# Patient Record
Sex: Male | Born: 1975 | Race: Black or African American | Hispanic: No | Marital: Single | Smoking: Current some day smoker
Health system: Southern US, Community
[De-identification: ages and names within clinical notes are randomized; demographics above are authoritative.]

## PROBLEM LIST (undated history)

## (undated) DIAGNOSIS — N200 Calculus of kidney: Secondary | ICD-10-CM

## (undated) HISTORY — PX: DENTAL SURGERY: SHX609

## (undated) HISTORY — PX: TONSILLECTOMY: SUR1361

---
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
## 2001-04-29 ENCOUNTER — Emergency Department (HOSPITAL_COMMUNITY): Admission: EM | Admit: 2001-04-29 | Discharge: 2001-04-29 | Payer: Self-pay

## 2003-11-23 ENCOUNTER — Emergency Department (HOSPITAL_COMMUNITY): Admission: EM | Admit: 2003-11-23 | Discharge: 2003-11-23 | Payer: Self-pay | Admitting: Emergency Medicine

## 2004-05-20 ENCOUNTER — Emergency Department (HOSPITAL_COMMUNITY): Admission: EM | Admit: 2004-05-20 | Discharge: 2004-05-20 | Payer: Self-pay | Admitting: Emergency Medicine

## 2005-02-08 ENCOUNTER — Emergency Department (HOSPITAL_COMMUNITY): Admission: EM | Admit: 2005-02-08 | Discharge: 2005-02-08 | Payer: Self-pay | Admitting: Emergency Medicine

## 2005-04-05 ENCOUNTER — Emergency Department (HOSPITAL_COMMUNITY): Admission: EM | Admit: 2005-04-05 | Discharge: 2005-04-05 | Payer: Self-pay | Admitting: Emergency Medicine

## 2005-09-10 ENCOUNTER — Emergency Department (HOSPITAL_COMMUNITY): Admission: EM | Admit: 2005-09-10 | Discharge: 2005-09-10 | Payer: Self-pay | Admitting: *Deleted

## 2005-11-03 ENCOUNTER — Emergency Department (HOSPITAL_COMMUNITY): Admission: EM | Admit: 2005-11-03 | Discharge: 2005-11-03 | Payer: Self-pay | Admitting: *Deleted

## 2005-11-26 ENCOUNTER — Emergency Department (HOSPITAL_COMMUNITY): Admission: EM | Admit: 2005-11-26 | Discharge: 2005-11-26 | Payer: Self-pay | Admitting: Emergency Medicine

## 2005-11-28 ENCOUNTER — Emergency Department (HOSPITAL_COMMUNITY): Admission: EM | Admit: 2005-11-28 | Discharge: 2005-11-28 | Payer: Self-pay | Admitting: Family Medicine

## 2006-07-26 ENCOUNTER — Emergency Department (HOSPITAL_COMMUNITY): Admission: EM | Admit: 2006-07-26 | Discharge: 2006-07-26 | Payer: Self-pay | Admitting: Family Medicine

## 2008-03-20 ENCOUNTER — Emergency Department (HOSPITAL_COMMUNITY): Admission: EM | Admit: 2008-03-20 | Discharge: 2008-03-20 | Payer: Self-pay | Admitting: Family Medicine

## 2008-07-08 ENCOUNTER — Emergency Department (HOSPITAL_COMMUNITY): Admission: EM | Admit: 2008-07-08 | Discharge: 2008-07-08 | Payer: Self-pay | Admitting: Emergency Medicine

## 2008-09-22 ENCOUNTER — Emergency Department (HOSPITAL_COMMUNITY): Admission: EM | Admit: 2008-09-22 | Discharge: 2008-09-22 | Payer: Self-pay | Admitting: Family Medicine

## 2008-10-29 ENCOUNTER — Emergency Department (HOSPITAL_COMMUNITY): Admission: EM | Admit: 2008-10-29 | Discharge: 2008-10-29 | Payer: Self-pay | Admitting: Family Medicine

## 2008-12-18 ENCOUNTER — Emergency Department (HOSPITAL_COMMUNITY): Admission: EM | Admit: 2008-12-18 | Discharge: 2008-12-18 | Payer: Self-pay | Admitting: Family Medicine

## 2009-01-15 ENCOUNTER — Emergency Department (HOSPITAL_COMMUNITY): Admission: EM | Admit: 2009-01-15 | Discharge: 2009-01-15 | Payer: Self-pay | Admitting: Emergency Medicine

## 2009-12-07 ENCOUNTER — Emergency Department (HOSPITAL_COMMUNITY)
Admission: EM | Admit: 2009-12-07 | Discharge: 2009-12-07 | Payer: Self-pay | Source: Home / Self Care | Admitting: Emergency Medicine

## 2010-05-11 LAB — URINALYSIS, ROUTINE W REFLEX MICROSCOPIC
Bilirubin Urine: NEGATIVE
Glucose, UA: NEGATIVE mg/dL
Hgb urine dipstick: NEGATIVE
Ketones, ur: NEGATIVE mg/dL
Nitrite: NEGATIVE
Protein, ur: NEGATIVE mg/dL
Specific Gravity, Urine: 1.029 (ref 1.005–1.030)
Urobilinogen, UA: 1 mg/dL (ref 0.0–1.0)
pH: 6 (ref 5.0–8.0)

## 2010-07-04 ENCOUNTER — Emergency Department (HOSPITAL_COMMUNITY)
Admission: EM | Admit: 2010-07-04 | Discharge: 2010-07-05 | Disposition: A | Discharge status: Home / Self Care | Payer: Self-pay | Attending: Emergency Medicine | Admitting: Emergency Medicine

## 2010-07-04 DIAGNOSIS — T63391A Toxic effect of venom of other spider, accidental (unintentional), initial encounter: Secondary | ICD-10-CM | POA: Insufficient documentation

## 2010-07-04 DIAGNOSIS — M25579 Pain in unspecified ankle and joints of unspecified foot: Secondary | ICD-10-CM | POA: Insufficient documentation

## 2010-07-04 DIAGNOSIS — T6391XA Toxic effect of contact with unspecified venomous animal, accidental (unintentional), initial encounter: Secondary | ICD-10-CM | POA: Insufficient documentation

## 2010-07-04 DIAGNOSIS — M25473 Effusion, unspecified ankle: Secondary | ICD-10-CM | POA: Insufficient documentation

## 2010-07-04 DIAGNOSIS — L989 Disorder of the skin and subcutaneous tissue, unspecified: Secondary | ICD-10-CM | POA: Insufficient documentation

## 2010-07-04 DIAGNOSIS — Y929 Unspecified place or not applicable: Secondary | ICD-10-CM | POA: Insufficient documentation

## 2010-07-04 DIAGNOSIS — M25476 Effusion, unspecified foot: Secondary | ICD-10-CM | POA: Insufficient documentation

## 2012-05-12 ENCOUNTER — Encounter (HOSPITAL_COMMUNITY): Payer: Self-pay

## 2012-05-12 ENCOUNTER — Emergency Department (HOSPITAL_COMMUNITY)
Admission: EM | Admit: 2012-05-12 | Discharge: 2012-05-12 | Discharge status: Against Medical Advice | Payer: Self-pay | Attending: Emergency Medicine | Admitting: Emergency Medicine

## 2012-05-12 DIAGNOSIS — J029 Acute pharyngitis, unspecified: Secondary | ICD-10-CM | POA: Insufficient documentation

## 2012-05-12 DIAGNOSIS — R69 Illness, unspecified: Secondary | ICD-10-CM

## 2012-05-12 DIAGNOSIS — F17200 Nicotine dependence, unspecified, uncomplicated: Secondary | ICD-10-CM | POA: Insufficient documentation

## 2012-05-12 NOTE — ED Provider Notes (Signed)
Medical screening examination/treatment/procedure(s) were performed by non-physician practitioner and as supervising physician I was immediately available for consultation/collaboration.   Richardean Canal, MD 05/12/12 [phone]

## 2012-05-12 NOTE — ED Notes (Signed)
Sore throat and  Painful to swallow, began this am.  Airway patent

## 2012-05-12 NOTE — ED Notes (Signed)
Pt st's he is leaving and walked out.

## 2012-05-12 NOTE — ED Provider Notes (Signed)
Date: 05/12/2012 Patient: Bryce Baxter Admitted: 05/12/2012  7:07 PM Attending Provider: No att. providers found  [name] or his authorized caregiver has made the decision for the patient to leave the emergency department against medical advice. The patient left before evaluation could be performed by provider. Vitals as follows:  Blood pressure 131/88, pulse 75, temperature 98.2 F (36.8 C), temperature source Oral, resp. rate 20, SpO2 100.00%.   [name]  has not signed the Leaving Against Medical Advice form prior to leaving the department.  Arthor Captain 05/12/2012      Arthor Captain, PA-C 05/12/12 2318

## 2012-05-13 ENCOUNTER — Emergency Department (HOSPITAL_COMMUNITY): Payer: Self-pay

## 2012-05-13 ENCOUNTER — Emergency Department (HOSPITAL_COMMUNITY)
Admission: EM | Admit: 2012-05-13 | Discharge: 2012-05-13 | Disposition: A | Discharge status: Home / Self Care | Payer: Self-pay | Attending: Emergency Medicine | Admitting: Emergency Medicine

## 2012-05-13 ENCOUNTER — Encounter (HOSPITAL_COMMUNITY): Payer: Self-pay | Admitting: Emergency Medicine

## 2012-05-13 DIAGNOSIS — R1310 Dysphagia, unspecified: Secondary | ICD-10-CM | POA: Insufficient documentation

## 2012-05-13 DIAGNOSIS — R6883 Chills (without fever): Secondary | ICD-10-CM | POA: Insufficient documentation

## 2012-05-13 DIAGNOSIS — K029 Dental caries, unspecified: Secondary | ICD-10-CM | POA: Insufficient documentation

## 2012-05-13 DIAGNOSIS — F17200 Nicotine dependence, unspecified, uncomplicated: Secondary | ICD-10-CM | POA: Insufficient documentation

## 2012-05-13 DIAGNOSIS — R5383 Other fatigue: Secondary | ICD-10-CM | POA: Insufficient documentation

## 2012-05-13 DIAGNOSIS — J029 Acute pharyngitis, unspecified: Secondary | ICD-10-CM | POA: Insufficient documentation

## 2012-05-13 DIAGNOSIS — R5381 Other malaise: Secondary | ICD-10-CM | POA: Insufficient documentation

## 2012-05-13 LAB — CBC WITH DIFFERENTIAL/PLATELET
Basophils Absolute: 0 10*3/uL (ref 0.0–0.1)
Basophils Relative: 0 % (ref 0–1)
Eosinophils Absolute: 0.1 10*3/uL (ref 0.0–0.7)
Eosinophils Relative: 1 % (ref 0–5)
HCT: 43.7 % (ref 39.0–52.0)
Hemoglobin: 15.9 g/dL (ref 13.0–17.0)
Lymphocytes Relative: 26 % (ref 12–46)
Lymphs Abs: 4.1 10*3/uL — ABNORMAL HIGH (ref 0.7–4.0)
MCH: 30.3 pg (ref 26.0–34.0)
MCHC: 36.4 g/dL — ABNORMAL HIGH (ref 30.0–36.0)
MCV: 83.4 fL (ref 78.0–100.0)
Monocytes Absolute: 1.2 10*3/uL — ABNORMAL HIGH (ref 0.1–1.0)
Monocytes Relative: 8 % (ref 3–12)
Neutro Abs: 10.3 10*3/uL — ABNORMAL HIGH (ref 1.7–7.7)
Neutrophils Relative %: 66 % (ref 43–77)
Platelets: 255 10*3/uL (ref 150–400)
RBC: 5.24 MIL/uL (ref 4.22–5.81)
RDW: 12.8 % (ref 11.5–15.5)
WBC: 15.8 10*3/uL — ABNORMAL HIGH (ref 4.0–10.5)

## 2012-05-13 LAB — RAPID STREP SCREEN (MED CTR MEBANE ONLY): Streptococcus, Group A Screen (Direct): NEGATIVE

## 2012-05-13 LAB — POCT I-STAT, CHEM 8
BUN: 13 mg/dL (ref 6–23)
Calcium, Ion: 1.22 mmol/L (ref 1.12–1.23)
Chloride: 100 mEq/L (ref 96–112)
Creatinine, Ser: 1.2 mg/dL (ref 0.50–1.35)
Glucose, Bld: 84 mg/dL (ref 70–99)
HCT: 50 % (ref 39.0–52.0)
Hemoglobin: 17 g/dL (ref 13.0–17.0)
Potassium: 3.6 mEq/L (ref 3.5–5.1)
Sodium: 140 mEq/L (ref 135–145)
TCO2: 29 mmol/L (ref 0–100)

## 2012-05-13 MED ORDER — LIDOCAINE VISCOUS 2 % MT SOLN
20.0000 mL | Freq: Once | OROMUCOSAL | Status: AC
  Administered 2012-05-13: 20 mL via OROMUCOSAL
  Filled 2012-05-13: qty 15

## 2012-05-13 MED ORDER — IOHEXOL 300 MG/ML  SOLN
80.0000 mL | Freq: Once | INTRAMUSCULAR | Status: AC | PRN
  Administered 2012-05-13: 80 mL via INTRAVENOUS

## 2012-05-13 MED ORDER — MORPHINE SULFATE 10 MG/ML IJ SOLN
10.0000 mg | Freq: Once | INTRAMUSCULAR | Status: AC
  Administered 2012-05-13: 10 mg via INTRAMUSCULAR
  Filled 2012-05-13: qty 1

## 2012-05-13 MED ORDER — MAGIC MOUTHWASH W/LIDOCAINE: 2.0000 mL | Freq: Four times a day (QID) | ORAL | Status: DC

## 2012-05-13 NOTE — ED Provider Notes (Signed)
History    This chart was scribed for non-physician practitioner working with Vida Roller, MD by Sofie Rower, ED Scribe. This patient was seen in room TR09C/TR09C and the patient's care was started at 6:07PM.   CSN: [id number]  Arrival date & time 05/13/12  1749   First MD Initiated Contact with Patient 05/13/12 1807      Chief Complaint  Patient presents with  . Sore Throat    (Consider location/radiation/quality/duration/timing/severity/associated sxs/prior treatment) Patient is a 37 y.o. male presenting with pharyngitis. The history is provided by the patient. No language interpreter was used.  Sore Throat This is a new problem. The current episode started more than 2 days ago (3 days ago). The problem occurs constantly. The problem has been gradually worsening. Pertinent negatives include no chest pain, no abdominal pain, no headaches and no shortness of breath. Exacerbated by: swallowing. Nothing relieves the symptoms. He has tried nothing for the symptoms. The treatment provided no relief.    Bryce Baxter is a 37 y.o. male , with a history of dental abscess, who presents to the Emergency Department complaining of sore throat, onset three days ago (05/10/12). Associated symptoms include pain and difficulty with swallowing, malaise and chills. The pt reports his sore throat feels as if there is a firey ball within the base of his throat gesturing toward the The pt denies difficulty breathing, otalgia, and sinus pressure.   The pt is a current everyday smoker, however, he does not drink alcohol.   Pt does not have a PCP.    History reviewed. No pertinent past medical history.  History reviewed. No pertinent past surgical history.  No family history on file.  History  Substance Use Topics  . Smoking status: Current Every Day Smoker  . Smokeless tobacco: Not on file  . Alcohol Use: No      Review of Systems  Constitutional: Negative for fever.  HENT: Positive for sore  throat and trouble swallowing. Negative for ear pain and sinus pressure.   Respiratory: Negative for shortness of breath.   Cardiovascular: Negative for chest pain.  Gastrointestinal: Negative for abdominal pain.  Neurological: Negative for headaches.    Allergies  Review of patient's allergies indicates no known allergies.  Home Medications  No current outpatient prescriptions on file.  BP 134/88  Pulse 95  Temp(Src) 99.9 F (37.7 C) (Oral)  Resp 20  SpO2 98%  Physical Exam  Nursing note and vitals reviewed. Constitutional: He is oriented to person, place, and time. He appears well-developed and well-nourished. No distress.  HENT:  Head: Normocephalic and atraumatic.  Right Ear: Tympanic membrane normal.  Left Ear: Tympanic membrane normal.  Mouth/Throat: Oropharynx is clear and moist. Abnormal dentition. Dental caries present. No dental abscesses or edematous. No oropharyngeal exudate, posterior oropharyngeal edema, posterior oropharyngeal erythema or tonsillar abscesses.  Eyes: EOM are normal.  Neck: Neck supple. No tracheal deviation present. No mass and no thyromegaly present.  Cardiovascular: Normal rate.   Pulmonary/Chest: Effort normal. No respiratory distress.  Musculoskeletal: Normal range of motion.  Neurological: He is alert and oriented to person, place, and time.  Skin: Skin is warm and dry.  Psychiatric: He has a normal mood and affect. His behavior is normal.    ED Course  Procedures (including critical care time)  DIAGNOSTIC STUDIES: Oxygen Saturation is 98% on room air, normal by my interpretation.    COORDINATION OF CARE:   6:39 PM- Treatment plan discussed with patient. Pt agrees with treatment.  Results for orders placed during the hospital encounter of 05/13/12  RAPID STREP SCREEN      Result Value Range   Streptococcus, Group A Screen (Direct) NEGATIVE  NEGATIVE  CBC WITH DIFFERENTIAL      Result Value Range   WBC 15.8 (*) 4.0 - 10.5  K/uL   RBC 5.24  4.22 - 5.81 MIL/uL   Hemoglobin 15.9  13.0 - 17.0 g/dL   HCT 96.0  45.4 - 09.8 %   MCV 83.4  78.0 - 100.0 fL   MCH 30.3  26.0 - 34.0 pg   MCHC 36.4 (*) 30.0 - 36.0 g/dL   RDW 11.9  14.7 - 82.9 %   Platelets 255  150 - 400 K/uL   Neutrophils Relative 66  43 - 77 %   Neutro Abs 10.3 (*) 1.7 - 7.7 K/uL   Lymphocytes Relative 26  12 - 46 %   Lymphs Abs 4.1 (*) 0.7 - 4.0 K/uL   Monocytes Relative 8  3 - 12 %   Monocytes Absolute 1.2 (*) 0.1 - 1.0 K/uL   Eosinophils Relative 1  0 - 5 %   Eosinophils Absolute 0.1  0.0 - 0.7 K/uL   Basophils Relative 0  0 - 1 %   Basophils Absolute 0.0  0.0 - 0.1 K/uL  POCT I-STAT, CHEM 8      Result Value Range   Sodium 140  135 - 145 mEq/L   Potassium 3.6  3.5 - 5.1 mEq/L   Chloride 100  96 - 112 mEq/L   BUN 13  6 - 23 mg/dL   Creatinine, Ser 5.62  0.50 - 1.35 mg/dL   Glucose, Bld 84  70 - 99 mg/dL   Calcium, Ion 1.30  8.65 - 1.23 mmol/L   TCO2 29  0 - 100 mmol/L   Hemoglobin 17.0  13.0 - 17.0 g/dL   HCT 78.4  69.6 - 29.5 %   Ct Soft Tissue Neck W Contrast  05/13/2012  *RADIOLOGY REPORT*  Clinical Data: Sore throat for 2 days.  Pain with swallowing.  CT NECK WITH CONTRAST  Technique:  Multidetector CT imaging of the neck was performed with intravenous contrast.  Contrast: 80mL OMNIPAQUE IOHEXOL 300 MG/ML  SOLN  Comparison: None.  Findings: No significant mucosal or submucosal lesions are present. This study is degraded at the level of the mandible and upper cervical spine by significant patient motion.  The palatine tonsils are within normal limits.  The submandibular glands appear somewhat prominent but likely distorted by the patient motion.  The thyroid is within normal limits.  No significant cervical adenopathy is present.  The lung apices are clear.  The bone windows are unremarkable.  IMPRESSION:  1.  No acute or focal abnormality to explain the patient's symptoms. 2.  The study is significantly distorted by patient motion as  described.   Original Report Authenticated By: Marin Roberts, M.D.        No results found.   1. Sore throat       MDM  Patient has very poor dentition with multiple caries and fracture.  He is a smoker and his presentation is somewhat abnormal in terms of where he feels his pain. His pharynx appears clear. He is nontoxic and afebrile, but  I would like to r/o developing ludwig's due to his teeth. With ct soft tissue of neck.  Patient is in agreement.     Ct negative for acute abnormality.  Will d/c with magic  mouthwash.  Patient has f/u with his dentist tomorrow. No gross dsentalabscess. CT unconcerning for Ludwig's angina or spread of infection.   The patient appears reasonably screened and/or stabilized for discharge and I doubt any other medical condition or other [facility] requiring further screening, evaluation, or treatment in the ED at this time prior to discharge.     I personally performed the services described in this documentation, which was scribed in my presence. The recorded information has been reviewed and is accurate.    Arthor Captain, PA-C 05/17/12 1241

## 2012-05-13 NOTE — ED Notes (Signed)
Pt c/o sore throat x 2 days. Pt was here yesterday but left. Pt reports pain with swallowing. Pt talking in complete sentences without difficulty.

## 2012-05-13 NOTE — ED Notes (Signed)
Patient is alert and orientedx4.  Patient was explained discharge instructions and they understood them with no questions.  The patient's mother, Elwyn Lade, is coming to transport the patient home.

## 2012-05-15 ENCOUNTER — Telehealth (HOSPITAL_COMMUNITY): Payer: Self-pay | Admitting: Emergency Medicine

## 2012-05-17 NOTE — ED Provider Notes (Signed)
Medical screening examination/treatment/procedure(s) were performed by non-physician practitioner and as supervising physician I was immediately available for consultation/collaboration.    Loni Abdon D Finnigan Warriner, MD 05/17/12 1643 

## 2012-10-01 ENCOUNTER — Encounter (HOSPITAL_COMMUNITY): Payer: Self-pay | Admitting: Emergency Medicine

## 2012-10-01 ENCOUNTER — Emergency Department (HOSPITAL_COMMUNITY)
Admission: EM | Admit: 2012-10-01 | Discharge: 2012-10-01 | Disposition: A | Discharge status: Home / Self Care | Payer: Self-pay | Attending: Emergency Medicine | Admitting: Emergency Medicine

## 2012-10-01 ENCOUNTER — Emergency Department (HOSPITAL_COMMUNITY): Payer: Self-pay

## 2012-10-01 DIAGNOSIS — M25519 Pain in unspecified shoulder: Secondary | ICD-10-CM | POA: Insufficient documentation

## 2012-10-01 DIAGNOSIS — M25511 Pain in right shoulder: Secondary | ICD-10-CM

## 2012-10-01 DIAGNOSIS — F17200 Nicotine dependence, unspecified, uncomplicated: Secondary | ICD-10-CM | POA: Insufficient documentation

## 2012-10-01 MED ORDER — KETOROLAC TROMETHAMINE 60 MG/2ML IM SOLN
60.0000 mg | Freq: Once | INTRAMUSCULAR | Status: AC
  Administered 2012-10-01: 60 mg via INTRAMUSCULAR
  Filled 2012-10-01: qty 2

## 2012-10-01 MED ORDER — IBUPROFEN 600 MG PO TABS: 600.0000 mg | ORAL_TABLET | Freq: Four times a day (QID) | ORAL | Status: DC | PRN

## 2012-10-01 MED ORDER — METHOCARBAMOL 500 MG PO TABS: 500.0000 mg | ORAL_TABLET | Freq: Two times a day (BID) | ORAL | Status: DC

## 2012-10-01 NOTE — ED Provider Notes (Signed)
Medical screening examination/treatment/procedure(s) were performed by non-physician practitioner and as supervising physician I was immediately available for consultation/collaboration.   Edye Hainline, MD 10/01/12 2321 

## 2012-10-01 NOTE — ED Provider Notes (Signed)
CSN: [id number]     Arrival date & time 10/01/12  1836 History  This chart was scribed for a non-physician practitioner working with Loren Racer, MD by Jiles Prows, ED scribe. This patient was seen in room TR05C/TR05C and the patient's care was started at 8:06 PM.  Chief Complaint  Patient presents with  . Shoulder Pain   The history is provided by the patient and medical records. No language interpreter was used.   HPI Comments: Bryce Baxter is a 37 y.o. male who presents to the Emergency Department complaining of moderate, constant pain to right arm onset 2 days ago.  He states that he cannot turn his head secondary to pain.  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  Pt believes that he pulled a muscle in the right back/ shoulder.  Pt denies headache, diaphoresis, fever, chills, nausea, vomiting, diarrhea, weakness, cough, SOB and any other pain.   History reviewed. No pertinent past medical history. History reviewed. No pertinent past surgical history. No family history on file. History  Substance Use Topics  . Smoking status: Current Every Day Smoker  . Smokeless tobacco: Not on file  . Alcohol Use: No    Review of Systems  All other systems reviewed and are negative.   Allergies  Review of patient's allergies indicates no known allergies.  Home Medications  No current outpatient prescriptions on file. BP 136/84  Pulse 58  Temp(Src) 98.4 F (36.9 C) (Oral)  Resp 14  SpO2 100% Physical Exam  Nursing note and vitals reviewed. Constitutional: He is oriented to person, place, and time. He appears well-developed and well-nourished. No distress.  HENT:  Head: Normocephalic and atraumatic.  Eyes: EOM are normal.  Neck: Neck supple. No tracheal deviation present.  Cardiovascular: Normal rate.   Pulmonary/Chest: Effort normal. No respiratory distress.  Musculoskeletal: Normal range of motion. He exhibits tenderness.  Right sided rhomboids and trapezius muscle  group tender to palpation.  Neurological: He is alert and oriented to person, place, and time.  Skin: Skin is warm and dry.  Psychiatric: He has a normal mood and affect. His behavior is normal.    ED Course  Procedures (including critical care time) DIAGNOSTIC STUDIES: Filed Vitals:   10/01/12 1928  BP: 136/84  Pulse: 58  Temp: 98.4 F (36.9 C)  TempSrc: Oral  Resp: 14  SpO2: 100%   COORDINATION OF CARE: 8:35 PM - Discussed ED treatment with pt at bedside including IM pain management and pill pain management and pt agrees. Pt advised to take ibuprofen 3x a day for a week and muscle relaxer every day.  Pt advised to ice area.  Labs Review Labs Reviewed - No data to display Imaging Review Dg Shoulder Right  10/01/2012   CLINICAL DATA:  Right posterior shoulder pain.  EXAM: RIGHT SHOULDER - 2+ VIEW  COMPARISON:  None.  FINDINGS: There is no evidence of fracture or dislocation. There is no evidence of arthropathy or other focal bone abnormality. Soft tissues are unremarkable.  IMPRESSION: Negative.   Electronically Signed   By: Charlett Nose   On: 10/01/2012 20:13    MDM   1. Shoulder pain, right    Musculoskeletal pain, likely muscle cramps of the rhomboids/trapezius. No bony deformity, range of motion intact. Outpatient followup.  I personally performed the services described in this documentation, which was scribed in my presence. The recorded information has been reviewed and is accurate.     Roxy Horseman, PA-C 10/01/12 2153

## 2012-10-01 NOTE — ED Notes (Addendum)
PT. REPORTS RIGHT SHOULDER MUSCLE STIFFNESS / SORENESS ONSET YESTERDAY , DENIES INJURY , PT. STATED HE LIFTS HEAVY DISHES AT WORK . PAIN WORSE WITH MOVEMENT AND CERTAIN POSITIONS .

## 2012-10-08 ENCOUNTER — Emergency Department (HOSPITAL_COMMUNITY)
Admission: EM | Admit: 2012-10-08 | Discharge: 2012-10-08 | Disposition: A | Discharge status: Home / Self Care | Payer: Self-pay | Attending: Emergency Medicine | Admitting: Emergency Medicine

## 2012-10-08 ENCOUNTER — Encounter (HOSPITAL_COMMUNITY): Payer: Self-pay | Admitting: *Deleted

## 2012-10-08 DIAGNOSIS — Y990 Civilian activity done for income or pay: Secondary | ICD-10-CM | POA: Insufficient documentation

## 2012-10-08 DIAGNOSIS — T23201A Burn of second degree of right hand, unspecified site, initial encounter: Secondary | ICD-10-CM

## 2012-10-08 DIAGNOSIS — T23259A Burn of second degree of unspecified palm, initial encounter: Secondary | ICD-10-CM | POA: Insufficient documentation

## 2012-10-08 DIAGNOSIS — X19XXXA Contact with other heat and hot substances, initial encounter: Secondary | ICD-10-CM | POA: Insufficient documentation

## 2012-10-08 DIAGNOSIS — Y9229 Other specified public building as the place of occurrence of the external cause: Secondary | ICD-10-CM | POA: Insufficient documentation

## 2012-10-08 DIAGNOSIS — F17200 Nicotine dependence, unspecified, uncomplicated: Secondary | ICD-10-CM | POA: Insufficient documentation

## 2012-10-08 MED ORDER — OXYCODONE-ACETAMINOPHEN 5-325 MG PO TABS: 1.0000 | ORAL_TABLET | ORAL | Status: DC | PRN

## 2012-10-08 MED ORDER — SILVER SULFADIAZINE 1 % EX CREA
TOPICAL_CREAM | Freq: Once | CUTANEOUS | Status: AC
  Administered 2012-10-08: 1 via TOPICAL
  Filled 2012-10-08: qty 85

## 2012-10-08 NOTE — ED Provider Notes (Signed)
CSN: [id number]     Arrival date & time 10/08/12  1808 History  This chart was scribed for non-physician practitioner Fayrene Helper, PA-C, working with Ward Givens, MD by Dorothey Baseman, ED Scribe. This patient was seen in room TR05C/TR05C and the patient's care was started at 6:32 PM.    Chief Complaint  Patient presents with  . Hand Burn    Patient is a 37 y.o. male presenting with burn. The history is provided by the patient. No language interpreter was used.  Burn Burn location:  Hand Hand burn location:  R palm Burn quality:  Intact blister Mechanism of burn:  Hot surface Incident location:  Work Tetanus status:  Up to date  HPI Comments: Bryce Baxter is a 37 y.o. male who presents to the Emergency Department complaining of a burn to the palm of his right hand that occurred 2 days ago when he picked up a hot sautee pan at work. He reports associated tenderness to the area and was able to work today. Patient denies any loss of sensation. Patient reports his tetanus vaccination is up to date.  History reviewed. No pertinent past medical history. History reviewed. No pertinent past surgical history. No family history on file. History  Substance Use Topics  . Smoking status: Current Every Day Smoker  . Smokeless tobacco: Not on file  . Alcohol Use: No    Review of Systems  Neurological: Negative for numbness.  All other systems reviewed and are negative.    Allergies  Review of patient's allergies indicates no known allergies.  Home Medications   Current Outpatient Rx  Name  Route  Sig  Dispense  Refill  . ibuprofen (ADVIL,MOTRIN) 600 MG tablet   Oral   Take 1 tablet (600 mg total) by mouth every 6 (six) hours as needed for pain.   30 tablet   0    Triage Vitals: BP 118/75  Pulse 86  Temp(Src) 97.7 F (36.5 C) (Oral)  Wt 153 lb (69.4 kg)  SpO2 96%  Physical Exam  Nursing note and vitals reviewed. Constitutional: He is oriented to person, place, and time. He  appears well-developed and well-nourished. No distress.  HENT:  Head: Normocephalic and atraumatic.  Eyes: Conjunctivae are normal.  Neck: Normal range of motion. Neck supple.  Musculoskeletal: Normal range of motion.  Neurological: He is alert and oriented to person, place, and time.  Skin: Skin is warm and dry.  Tenderness to palpation to the right palmar surface. Second degree skin burn to the right palmar surface. Intact blisters overlying the thenar eminence, measuring 2 x 3 cm.  Psychiatric: He has a normal mood and affect. His behavior is normal.    ED Course  Procedures (including critical care time)  DIAGNOSTIC STUDIES: Oxygen Saturation is 96% on room air, adequate by my interpretation.    COORDINATION OF CARE: 6:34PM- Will order Silvadene cream to treat the burn and advised patient to apply it daily for 5-7 days or until the burn is gone. Advised patient not to pop any blisters to the area. Advised patient to follow up if there are any signs of infection. Discussed treatment plan with patient at bedside and patient verbalized agreement.   Labs Review Labs Reviewed - No data to display Imaging Review No results found.  MDM   1. Burn of hand, second degree, right, initial encounter    BP 118/75  Pulse 86  Temp(Src) 97.7 F (36.5 C) (Oral)  Wt 153 lb (69.4 kg)  SpO2 96%  I personally performed the services described in this documentation, which was scribed in my presence. The recorded information has been reviewed and is accurate.     Fayrene Helper, PA-C 10/08/12 1844

## 2012-10-08 NOTE — ED Provider Notes (Signed)
Medical screening examination/treatment/procedure(s) were performed by non-physician practitioner and as supervising physician I was immediately available for consultation/collaboration. Devoria Albe, MD, Armando Gang   Ward Givens, MD 10/08/12 [phone]

## 2012-10-08 NOTE — ED Notes (Signed)
Pt burned right inner palm on sautee pan on Saturday.  Pt has intact large blister to area.

## 2012-10-08 NOTE — Discharge Instructions (Signed)
Apply cream to affected area twice daily for the next 7 days.  Take pain medication as needed.  Return if you notice signs of infection.  Burn Care Your skin is a natural barrier to infection. It is the largest organ of your body. Burns damage this natural protection. To help prevent infection, it is very important to follow your caregiver's instructions in the care of your burn. Burns are classified as:  First degree. There is only redness of the skin (erythema). No scarring is expected.  Second degree. There is blistering of the skin. Scarring may occur with deeper burns.  Third degree. All layers of the skin are injured, and scarring is expected. HOME CARE INSTRUCTIONS   Wash your hands well before changing your bandage.  Change your bandage as often as directed by your caregiver.  Remove the old bandage. If the bandage sticks, you may soak it off with cool, clean water.  Cleanse the burn thoroughly but gently with mild soap and water.  Pat the area dry with a clean, dry cloth.  Apply a thin layer of antibacterial cream to the burn.  Apply a clean bandage as instructed by your caregiver.  Keep the bandage as clean and dry as possible.  Elevate the affected area for the first 24 hours, then as instructed by your caregiver.  Only take over-the-counter or prescription medicines for pain, discomfort, or fever as directed by your caregiver. SEEK IMMEDIATE MEDICAL CARE IF:   You develop excessive pain.  You develop redness, tenderness, swelling, or red streaks near the burn.  The burned area develops yellowish-white fluid (pus) or a bad smell.  You have a fever. MAKE SURE YOU:   Understand these instructions.  Will watch your condition.  Will get help right away if you are not doing well or get worse. Document Released: 01/24/2005 Document Revised: 04/18/2011 Document Reviewed: 06/16/2010 [facility] Patient Information 2014 [place], [place].

## 2012-10-15 ENCOUNTER — Encounter (HOSPITAL_COMMUNITY): Payer: Self-pay | Admitting: Emergency Medicine

## 2012-10-15 ENCOUNTER — Emergency Department (HOSPITAL_COMMUNITY)
Admission: EM | Admit: 2012-10-15 | Discharge: 2012-10-15 | Disposition: A | Discharge status: Home / Self Care | Payer: Self-pay | Attending: Emergency Medicine | Admitting: Emergency Medicine

## 2012-10-15 DIAGNOSIS — L259 Unspecified contact dermatitis, unspecified cause: Secondary | ICD-10-CM

## 2012-10-15 DIAGNOSIS — F17200 Nicotine dependence, unspecified, uncomplicated: Secondary | ICD-10-CM | POA: Insufficient documentation

## 2012-10-15 DIAGNOSIS — L258 Unspecified contact dermatitis due to other agents: Secondary | ICD-10-CM | POA: Insufficient documentation

## 2012-10-15 NOTE — ED Notes (Signed)
PA at bedside.

## 2012-10-15 NOTE — ED Provider Notes (Signed)
CSN: [id number]     Arrival date & time 10/15/12  1951 History   First MD Initiated Contact with Patient 10/15/12 2018     Chief Complaint  Patient presents with  . Blister   (Consider location/radiation/quality/duration/timing/severity/associated sxs/prior Treatment) HPI  Bryce Baxter is a 37 y.o. male complaining of new onset 2 stinging blisters to dorsum of right hand. Patient was seen for a thermal blister to almost right hand several days ago. He's been applying Silvadene, patient works as a Public affairs consultant and when he is working he applies caused the blister, wrapped it tightly in tape and then inserted into a thick plastic dishwashing gloves. He then applies tape to the wrist so that no water is allowed to enter.  History reviewed. No pertinent past medical history. History reviewed. No pertinent past surgical history. No family history on file. History  Substance Use Topics  . Smoking status: Current Every Day Smoker  . Smokeless tobacco: Not on file  . Alcohol Use: No    Review of Systems 10 systems reviewed and found to be negative, except as noted in the HPI   Allergies  Review of patient's allergies indicates no known allergies.  Home Medications   Current Outpatient Rx  Name  Route  Sig  Dispense  Refill  . ibuprofen (ADVIL,MOTRIN) 600 MG tablet   Oral   Take 1 tablet (600 mg total) by mouth every 6 (six) hours as needed for pain.   30 tablet   0   . oxyCODONE-acetaminophen (PERCOCET/ROXICET) 5-325 MG per tablet   Oral   Take 1 tablet by mouth every 4 (four) hours as needed for pain.   20 tablet   0    BP 114/74  Pulse 74  Temp(Src) 98.3 F (36.8 C) (Oral)  Resp 18  SpO2 98% Physical Exam  Nursing note and vitals reviewed. Constitutional: He is oriented to person, place, and time. He appears well-developed and well-nourished. No distress.  HENT:  Head: Normocephalic.  Mouth/Throat: Oropharynx is clear and moist.  Eyes: Conjunctivae and EOM are normal.  Pupils are equal, round, and reactive to light.  Cardiovascular: Normal rate.   Pulmonary/Chest: Effort normal and breath sounds normal. No stridor. No respiratory distress. He has no wheezes. He has no rales. He exhibits no tenderness.  Abdominal: Soft. Bowel sounds are normal. He exhibits no distension and no mass. There is no tenderness. There is no rebound and no guarding.  Musculoskeletal: Normal range of motion.  Neurological: He is alert and oriented to person, place, and time.  Skin:  Well-healing second-degree blister to palm of right hand on the thenar eminence, and intact.  Patient has scattered, 3-4 mm posterior to dorsum of right hand where patient came into contact with tape. No erythema, warmth, purulent discharge.   Psychiatric: He has a normal mood and affect.          ED Course  Procedures (including critical care time) Labs Review Labs Reviewed - No data to display Imaging Review No results found.  MDM   1. Contact dermatitis    Filed Vitals:   10/15/12 2000  BP: 114/74  Pulse: 74  Temp: 98.3 F (36.8 C)  TempSrc: Oral  Resp: 18  SpO2: 98%     Bryce Baxter is a 37 y.o. male with blisters in the perfect distribution of where tape is applied to the hand. This is likely a contact dermatitis or a milia type rash from the heat and humidity. No signed of infection.  Advised DC of tape and leave open to air. Return precautions discussed.   Pt is hemodynamically stable, appropriate for, and amenable to discharge at this time. Pt verbalized understanding and agrees with care plan. All questions answered. Outpatient follow-up and specific return precautions discussed.    Note: Portions of this report may have been transcribed using voice recognition software. Every effort was made to ensure accuracy; however, inadvertent computerized transcription errors may be present      Wynetta Emery, PA-C 10/16/12 1812

## 2012-10-15 NOTE — ED Notes (Signed)
Pt. reports persistent pain at right hand with blisters , seen here 9/1/ 2014 prescribed with Percocet and referral to Dr. Amanda Pea.

## 2012-10-16 ENCOUNTER — Telehealth (HOSPITAL_COMMUNITY): Payer: Self-pay | Admitting: *Deleted

## 2012-10-16 NOTE — ED Notes (Signed)
Patient called because he was asked by [employer] Experience if got his rx filled. Patient was notified by current scriber that no rx was ordered on  Last visit.

## 2012-10-17 NOTE — ED Provider Notes (Signed)
Medical screening examination/treatment/procedure(s) were performed by non-physician practitioner and as supervising physician I was immediately available for consultation/collaboration.   Loren Racer, MD 10/17/12 [phone]

## 2013-03-22 ENCOUNTER — Emergency Department (HOSPITAL_COMMUNITY)
Admission: EM | Admit: 2013-03-22 | Discharge: 2013-03-22 | Disposition: A | Discharge status: Home / Self Care | Payer: Self-pay | Attending: Emergency Medicine | Admitting: Emergency Medicine

## 2013-03-22 ENCOUNTER — Encounter (HOSPITAL_COMMUNITY): Payer: Self-pay | Admitting: Emergency Medicine

## 2013-03-22 DIAGNOSIS — B353 Tinea pedis: Secondary | ICD-10-CM | POA: Insufficient documentation

## 2013-03-22 DIAGNOSIS — Z87828 Personal history of other (healed) physical injury and trauma: Secondary | ICD-10-CM | POA: Insufficient documentation

## 2013-03-22 DIAGNOSIS — B351 Tinea unguium: Secondary | ICD-10-CM | POA: Insufficient documentation

## 2013-03-22 DIAGNOSIS — M79676 Pain in unspecified toe(s): Secondary | ICD-10-CM

## 2013-03-22 DIAGNOSIS — F17200 Nicotine dependence, unspecified, uncomplicated: Secondary | ICD-10-CM | POA: Insufficient documentation

## 2013-03-22 MED ORDER — TERBINAFINE HCL 1 % EX CREA: 1.0000 "application " | TOPICAL_CREAM | Freq: Two times a day (BID) | CUTANEOUS | Status: DC

## 2013-03-22 MED ORDER — IBUPROFEN 600 MG PO TABS: 600.0000 mg | ORAL_TABLET | Freq: Four times a day (QID) | ORAL | Status: DC | PRN

## 2013-03-22 MED ORDER — TRAMADOL HCL 50 MG PO TABS: 50.0000 mg | ORAL_TABLET | Freq: Four times a day (QID) | ORAL | Status: DC | PRN

## 2013-03-22 NOTE — ED Notes (Signed)
Pt in c/o left pinky toe pain, states a TV tell on it 5 years ago and he has residual pain.

## 2013-03-22 NOTE — ED Provider Notes (Signed)
Medical screening examination/treatment/procedure(s) were performed by non-physician practitioner and as supervising physician I was immediately available for consultation/collaboration.  EKG Interpretation   None         Kristen N Ward, DO 03/22/13 2350 

## 2013-03-22 NOTE — Discharge Instructions (Signed)
Please read and follow all provided instructions.  Your diagnoses today include:  1. Toe pain   2. Tinea pedis   3. Onychomycosis     Tests performed today include:  Vital signs. See below for your results today.   Medications prescribed:   Tramadol - narcotic-like pain medication  DO NOT drive or perform any activities that require you to be awake and alert because this medicine can make you drowsy.    Ibuprofen (Motrin, Advil) - anti-inflammatory pain medication  Do not exceed 600mg  ibuprofen every 6 hours, take with food  You have been prescribed an anti-inflammatory medication or NSAID. Take with food. Take smallest effective dose for the shortest duration needed for your pain. Stop taking if you experience stomach pain or vomiting.    Lamisil - medication for athlete's foot  Take any prescribed medications only as directed.  Home care instructions:   Follow any educational materials contained in this packet  Follow R.I.C.E. Protocol:  R - rest your injury   I  - use ice on injury without applying directly to skin  C - compress injury with bandage or splint  E - elevate the injury as much as possible  Follow-up instructions: Please follow-up with your primary care provider or the provided orthopedic physician (bone specialist) if you continue to have significant pain or trouble walking in 1 week. In this case you may have a severe injury that requires further care.   If you do not have a primary care doctor -- see below for referral information.   Return instructions:   Please return if your toes are numb or tingling, appear gray or blue, or you have severe painPlease return to the Emergency Department if you experience worsening symptoms.   Please return if you have any other emergent concerns.  Additional Information:  Your vital signs today were: BP 128/79   Pulse 60   Temp(Src) 97.9 F (36.6 C) (Oral)   Resp 15   SpO2 98% If your blood pressure (BP)  was elevated above 135/85 this visit, please have this repeated by your doctor within one month. --------------  Emergency Department Resource Guide 1) Find a Doctor and Pay Out of Pocket Although you won't have to find out who is covered by your insurance plan, it is a good idea to ask around and get recommendations. You will then need to call the office and see if the doctor you have chosen will accept you as a new patient and what types of options they offer for patients who are self-pay. Some doctors offer discounts or will set up payment plans for their patients who do not have insurance, but you will need to ask so you aren't surprised when you get to your appointment.  2) Contact Your Local Health Department Not all health departments have doctors that can see patients for sick visits, but many do, so it is worth a call to see if yours does. If you don't know where your local health department is, you can check in your phone book. The CDC also has a tool to help you locate your state's health department, and many state websites also have listings of all of their local health departments.  3) Find a Walk-in Clinic If your illness is not likely to be very severe or complicated, you may want to try a walk in clinic. These are popping up all over the country in pharmacies, drugstores, and shopping centers. They're usually staffed by nurse practitioners or  physician assistants that have been trained to treat common illnesses and complaints. They're usually fairly quick and inexpensive. However, if you have serious medical issues or chronic medical problems, these are probably not your best option.  No Primary Care Doctor: - Call Health Connect at  [phone] - they can help you locate a primary care doctor that  accepts your insurance, provides certain services, etc. - Physician Referral Service- [phone]  Chronic Pain Problems: Organization         Address  Phone   Notes  [facility]  [phone] Patients need to be referred by their primary care doctor.   Medication Assistance: Organization         Address  Phone   Notes  [facility] Medication [facility] [address] [place] [place], [place] [address] [phone] --Must be a resident of [facility] -- Must have NO insurance coverage whatsoever (no Medicaid/ Medicare, etc.) -- The pt. MUST have a primary care doctor that directs their care regularly and follows them in the community   [facility]ist  [phone]   [employer]  [phone]    Agencies that provide inexpensive medical care: Organization         Address  Phone   Notes  [facility]  [phone]   [facility]    [phone]   [facility] [address] [place], [place] [address] [phone]   [facility] [address] [place] [place] [phone]   Planned Parenthood    [phone]   [facility]    [phone]   [facility]  [address], [place] Phone:  [phone], Fax:  [phone] Hours of Operation:  9 am - 6 pm, M-F.  Also accepts Medicaid/Medicare and self-pay.  [facility]  [address], [place] Phone: [phone], Fax: [phone]. Hours of Operation:  8:30 am - 5:30 pm, M-F.  Also accepts Medicaid and self-pay.  [facility] [place] [address] [place] Phone: [phone]   [facility] [address] Natasha Bence [place], [place] [phone], Ext. [phone] Mondays & Thursdays: 7-9 AM.  First 15 patients are seen on a first come, first serve basis.    Medicaid-accepting [facility] Providers:  Organization         Address  Phone   Notes  [facility] [address], [place] [phone] Also accepts self-pay patients.  [facility] [address]  Laurell Josephs [place], [place]  [phone]   [facility] [address], [place] [phone]   [facility] [address], [place] [phone]   Renaye Rakers [address], [place]   [phone] Only accepts [place] patients after they have their name applied to their card.   Self-Pay (no insurance) in [facility]:  Organization         Address  Phone   Notes  Sickle Cell Patients, [facility] [address] [place], [place] [phone]   [facility] [address] [place], [place] [phone]   [facility] [place]  [address], [place] [phone]   [facility]/Dr. Julio Sicks  [address] [place]  [place] [address], [place] or [address], [place] [phone] Phone number for both [place] and [place] locations is the same.  [facility] [address] [place] [phone]   [facility] [address], [place] or [address] [phone]   [facility] [address] [place] [phone], phone; [phone], fax Sees patients 1st and 3rd Saturday of every month.  Must not qualify for public or private insurance (i.e. Medicaid, Medicare, [place] Health Choice, Veterans' Benefits)  Household income should be no more than 200% of the poverty level The clinic cannot treat you if you are pregnant or think you are pregnant  Sexually transmitted diseases are not treated at the clinic.    Dental Care: Organization         Address  Phone  Notes  [facility] Department of [facility] [address] [place], [place] [phone] Accepts children up to age 10 who are enrolled in [place] or [place] Health Choice; pregnant women with a Medicaid card; and children who have applied for Medicaid  or [place] Health Choice, but were declined, whose parents can pay a reduced fee at time of service.  [facility] of [facility]  [address], [place] [phone] Accepts children up to age 57 who are enrolled in [place] or [place] Health Choice; pregnant women with a Medicaid card; and children who have applied for Medicaid or  Health Choice, but were declined, whose parents can pay a reduced fee at time of service.  [facility]  [address] [place], [place] [phone] Patients are seen by appointment only. Walk-ins are not accepted. [facility] will see patients 61 years of age and older. Monday - Tuesday (8am-5pm) Most Wednesdays (8:30-5pm) $30 per visit, cash only  [facility]  [address], [facility] [phone] Patients are seen by appointment only. Walk-ins are not accepted. [facility] will see patients 37 years of age and older. One Wednesday Evening (Monthly: Volunteer Based).  $30 per visit, cash only  [employer]  [phone] for adults; Children under age 68, call Graduate Pediatric Dentistry at [phone]. Children aged 12-14, please call [phone] to request a pediatric application.  Dental services are provided in all areas of dental care including fillings, crowns and bridges, complete and partial dentures, implants, gum treatment, root canals, and extractions. Preventive care is also provided. Treatment is provided to both adults and children. Patients are selected via a lottery and there is often a waiting list.   [facility] [address] [place]  [phone] [email or web address]   [facility] [address] [place], [place] [phone], Ext. [phone] Second and Fourth Thursday of each month, opens at 6:30 AM; Clinic ends at 9 AM.  Patients are seen on a first-come first-served basis, and a limited number are seen  during each clinic.   [facility]  [address] [place], [place] [phone]   Eligibility Requirements You must have lived in [place], [place], or [place] counties for at least the last three months.   You cannot be eligible for state or federal sponsored [place], including [employer], [place], or [employer].   You generally cannot be eligible for healthcare insurance through your employer.    How  to apply: Eligibility screenings are held every Tuesday and Wednesday afternoon from 1:00 pm until 4:00 pm. You do not need an appointment for the interview!  [facility] [address], [place], [place] [phone]   [facility]  [phone]   [facility]  [phone]   [facility]  [phone]    Behavioral Health Resources in the Community: Intensive Outpatient Programs Organization         Address  Phone  Notes  [facility] [address], [place], [place] [phone]   [facility] [address], [place], [place] [phone]   [facility] [address], [place], [place]  [phone]   [facility] [address],  [place], [place] [phone] or [phone]   Substance Abuse Resources Organization         Address  Phone  Notes  [facility]  [phone]   [facility]  [phone]   [facility]  [phone]   [facility]  [phone]   Residential & Outpatient Substance Abuse Program  [phone]   Psychological Services Organization         Address  Phone  Notes  [facility]  [phone]- [phone]   [facility]  [phone]- [phone]   [facility] [address], [place] [phone] or [phone]    Mobile Crisis Teams Organization         Address  Phone  Notes  [facility],  Mobile Crisis Care Unit  [phone]   [facility]  [address] [place], [place] [phone]   Doristine LocksSharon DeEsch [address] [place] [phone]    Self-Help/Support Groups Organization         Address  Phone             Notes  [facility] of [employer] - variety of support groups  [phone]- [phone] Call for more information  Narcotics Anonymous (NA), [facility] [address] [place] [address], [place]  2 meetings at this location   Statisticianesidential Treatment Programs Organization         Address  Phone  Notes  [facility] [address],    [place]  [phone]   [facility]  [address], [place] [phone], [place], [place] [phone]   [facility] [address], [place] [phone] Admissions: 8am-3pm M-F  [facility] [address]    [place], [place] [phone]   [facility] [address], [place], [place] [phone]   [facility] [address]  [place], [place] [phone]   [facility] - Intensive Outpatient [address], [place], [place] [phone]   [facility] ([facility] [address]  [place], [place] [phone] or [phone]   [facility] ([facility]) [address] [place], [place] [phone] Accepts Medicaid  [facility] [address]  [place] [phone] Substance Abuse/Addiction Treatment   [facility] Resources Organization         Address  Phone  Notes  [facility]  [phone]   Angie FavaJulie Brannon, PhD [address], [address] [place], [place]   [phone] or [phone]) [phone]   [facility]   [address] [place], [place] [phone]) [phone]   [facility] [address], [place],  [place] [phone] Insurance/Medicaid/sponsorship through [facility] [address]                                    [place], [place] [phone] Therapy/tele-psych/case  [facility] [address]   [place], [place] [phone]    Dr. Lolly Mustache  [phone]   [facility] [place]  [employer] [facility] Dept. 1) [address], [place] 2) [address] [place] 3)  [address], [place] [phone]  [phone]   [facility] Child Abuse Hotline 919-482-9679 or [phone] (After Hours)

## 2013-03-22 NOTE — ED Provider Notes (Signed)
CSN: [id number]     Arrival date & time 03/22/13  1849 History  This chart was scribed for non-physician practitioner Bryce BleacherJosh Tywone Bembenek, PA-C working with Bryce MawKristen N Ward, DO by Bryce Baxter, ED Scribe. This patient was seen in room TR11C/TR11C and the patient's care was started at 8:16 PM .   Chief Complaint  Patient presents with  . Foot Pain   The history is provided by the patient. No language interpreter was used.   HPI Comments: Bryce Baxter is a 38 y.o. male who presents to the Emergency Department complaining of ongoing L fifth digit pain. Pt states he had a television fall onto his L foot and states he was informed by orthopedist "the pain could affect him in the near future". Pt states "his toe was shattered" 5 years ago from incident. Pt states his pain "feels like pins and needles". He denies being seen by a provider or having a PCP. Pt denies taking any OTC medications. Pt denies hx of DM and HTN. Pt denies weakness, numbness and tingling.    History reviewed. No pertinent past medical history. History reviewed. No pertinent past surgical history. History reviewed. No pertinent family history. History  Substance Use Topics  . Smoking status: Current Every Day Smoker  . Smokeless tobacco: Not on file  . Alcohol Use: No    Review of Systems  Constitutional: Negative for activity change.  Musculoskeletal: Positive for arthralgias. Negative for back pain, gait problem, joint swelling and neck pain.  Skin: Negative for wound.  Neurological: Negative for weakness and numbness.    Allergies  Review of patient's allergies indicates no known allergies.  Home Medications   Current Outpatient Rx  Name  Route  Sig  Dispense  Refill  . ibuprofen (ADVIL,MOTRIN) 600 MG tablet   Oral   Take 1 tablet (600 mg total) by mouth every 6 (six) hours as needed for pain.   30 tablet   0   . oxyCODONE-acetaminophen (PERCOCET/ROXICET) 5-325 MG per tablet   Oral   Take 1 tablet  by mouth every 4 (four) hours as needed for pain.   20 tablet   0    BP 128/79  Pulse 60  Temp(Src) 97.9 F (36.6 C) (Oral)  Resp 15  SpO2 98%  Physical Exam  Nursing note and vitals reviewed. Constitutional: He appears well-developed and well-nourished. No distress.  HENT:  Head: Normocephalic and atraumatic.  Eyes: Conjunctivae and EOM are normal.  Neck: Normal range of motion. Neck supple. No tracheal deviation present.  Cardiovascular: Normal rate and normal pulses.   Pulmonary/Chest: Effort normal. No respiratory distress.  Musculoskeletal: Normal range of motion. He exhibits tenderness. He exhibits no edema.  Tenderness over the L 5th toes. Fungal infection of all nails to L foot. Fungal infection in between the toes. No cellulitis.   Neurological: He is alert. No sensory deficit.  Motor, sensation, and vascular distal to the injury is fully intact.   Skin: Skin is warm and dry.  Psychiatric: He has a normal mood and affect. His behavior is normal.    ED Course  Procedures  DIAGNOSTIC STUDIES: Oxygen Saturation is 98% on RA, normal by my interpretation.    COORDINATION OF CARE: 8:19 PM-Discussed treatment plan which includes discharge patient with Tramadol and Amcill. Advised pt to F/U with Podiatrist and use resource guide provided. Pt agreed to plan.   Labs Review Labs Reviewed - No data to display Imaging Review No results found.  EKG Interpretation  None       Vital signs reviewed and are as follows: Filed Vitals:   03/22/13 1853  BP: 128/79  Pulse: 60  Temp: 97.9 F (36.6 C)  Resp: 15   Tramadol for toe pain. Podiatry referral given. Patient counseled on use of narcotic pain medications. Counseled not to combine these medications with others containing tylenol. Urged not to drink alcohol, drive, or perform any other activities that requires focus while taking these medications. The patient verbalizes understanding and agrees with the  plan.  Lamisil for athlete's foot.    MDM   Final diagnoses:  Toe pain  Tinea pedis  Onychomycosis   Patient with toe pain likely stemming from previous injury. He also has toenail infection and tinea pedis which likely is not helping. He needs to followup with podiatry. Referrals given. No emergent conditions identified.   I personally performed the services described in this documentation, which was scribed in my presence. The recorded information has been reviewed and is accurate.    Renne Crigler, PA-C 03/22/13 2036

## 2013-04-22 ENCOUNTER — Emergency Department (HOSPITAL_COMMUNITY)
Admission: EM | Admit: 2013-04-22 | Discharge: 2013-04-22 | Disposition: A | Discharge status: Home / Self Care | Payer: Self-pay | Attending: Emergency Medicine | Admitting: Emergency Medicine

## 2013-04-22 ENCOUNTER — Encounter (HOSPITAL_COMMUNITY): Payer: Self-pay | Admitting: Emergency Medicine

## 2013-04-22 DIAGNOSIS — Z791 Long term (current) use of non-steroidal anti-inflammatories (NSAID): Secondary | ICD-10-CM | POA: Insufficient documentation

## 2013-04-22 DIAGNOSIS — F17200 Nicotine dependence, unspecified, uncomplicated: Secondary | ICD-10-CM | POA: Insufficient documentation

## 2013-04-22 DIAGNOSIS — M5412 Radiculopathy, cervical region: Secondary | ICD-10-CM

## 2013-04-22 MED ORDER — NAPROXEN 500 MG PO TABS: 500.0000 mg | ORAL_TABLET | Freq: Two times a day (BID) | ORAL | Status: DC

## 2013-04-22 NOTE — Discharge Instructions (Signed)
Cervical Radiculopathy  Cervical radiculopathy happens when a nerve in the neck is pinched or bruised by a slipped (herniated) disk or by arthritic changes in the bones of the cervical spine. This can occur due to an injury or as part of the normal aging process. Pressure on the cervical nerves can cause pain or numbness that runs from your neck all the way down into your arm and fingers.  CAUSES   There are many possible causes, including:  · Injury.  · Muscle tightness in the neck from overuse.  · Swollen, painful joints (arthritis).  · Breakdown or degeneration in the bones and joints of the spine (spondylosis) due to aging.  · Bone spurs that may develop near the cervical nerves.  SYMPTOMS   Symptoms include pain, weakness, or numbness in the affected arm and hand. Pain can be severe or irritating. Symptoms may be worse when extending or turning the neck.  DIAGNOSIS   Your caregiver will ask about your symptoms and do a physical exam. He or she may test your strength and reflexes. X-rays, CT scans, and MRI scans may be needed in cases of injury or if the symptoms do not go away after a period of time. Electromyography (EMG) or nerve conduction testing may be done to study how your nerves and muscles are working.  TREATMENT   Your caregiver may recommend certain exercises to help relieve your symptoms. Cervical radiculopathy can, and often does, get better with time and treatment. If your problems continue, treatment options may include:  · Wearing a soft collar for short periods of time.  · Physical therapy to strengthen the neck muscles.  · Medicines, such as nonsteroidal anti-inflammatory drugs (NSAIDs), oral corticosteroids, or spinal injections.  · Surgery. Different types of surgery may be done depending on the cause of your problems.  HOME CARE INSTRUCTIONS   · Put ice on the affected area.  · Put ice in a plastic bag.  · Place a towel between your skin and the bag.  · Leave the ice on for 15-20 minutes,  03-04 times a day or as directed by your caregiver.  · If ice does not help, you can try using heat. Take a warm shower or bath, or use a hot water bottle as directed by your caregiver.  · You may try a gentle neck and shoulder massage.  · Use a flat pillow when you sleep.  · Only take over-the-counter or prescription medicines for pain, discomfort, or fever as directed by your caregiver.  · If physical therapy was prescribed, follow your caregiver's directions.  · If a soft collar was prescribed, use it as directed.  SEEK IMMEDIATE MEDICAL CARE IF:   · Your pain gets much worse and cannot be controlled with medicines.  · You have weakness or numbness in your hand, arm, face, or leg.  · You have a high fever or a stiff, rigid neck.  · You lose bowel or bladder control (incontinence).  · You have trouble with walking, balance, or speaking.  MAKE SURE YOU:   · Understand these instructions.  · Will watch your condition.  · Will get help right away if you are not doing well or get worse.  Document Released: 10/19/2000 Document Revised: 04/18/2011 Document Reviewed: 09/07/2010  ExitCare® Patient Information ©2014 [employer].

## 2013-04-22 NOTE — ED Notes (Signed)
Pt. Stated, I've had left hand numbness for 3 days.  No injury.

## 2013-04-22 NOTE — ED Notes (Signed)
Pt reporting left hand numbness for 2 days. Sensation intact. No injury. Numbness continues up to elbow. No neuro deficits. Pt is a x 4. No swelling or deformity noted.

## 2013-04-23 NOTE — ED Provider Notes (Signed)
Medical screening examination/treatment/procedure(s) were performed by non-physician practitioner and as supervising physician I was immediately available for consultation/collaboration.   EKG Interpretation None        Junius ArgyleForrest S Ladonne Sharples, MD 04/23/13 1535

## 2013-04-23 NOTE — ED Provider Notes (Signed)
CSN: [id number]     Arrival date & time 04/22/13  1611 History   First MD Initiated Contact with Patient 04/22/13 1806     Chief Complaint  Patient presents with  . Hand Pain     (Consider location/radiation/quality/duration/timing/severity/associated sxs/prior Treatment) Patient is a 38 y.o. male presenting with hand pain. The history is provided by the patient. No language interpreter was used.  Hand Pain This is a new problem. The current episode started in the past 7 days. The problem occurs constantly. The problem has been waxing and waning. Associated symptoms include arthralgias. He has tried nothing for the symptoms.    History reviewed. No pertinent past medical history. History reviewed. No pertinent past surgical history. No family history on file. History  Substance Use Topics  . Smoking status: Current Every Day Smoker  . Smokeless tobacco: Not on file  . Alcohol Use: No    Review of Systems  Musculoskeletal: Positive for arthralgias.  All other systems reviewed and are negative.      Allergies  Review of patient's allergies indicates no known allergies.  Home Medications   Current Outpatient Rx  Name  Route  Sig  Dispense  Refill  . naproxen (NAPROSYN) 500 MG tablet   Oral   Take 1 tablet (500 mg total) by mouth 2 (two) times daily.   20 tablet   0    BP 115/75  Pulse 66  Temp(Src) 97.8 F (36.6 C) (Oral)  Resp 19  SpO2 100% Physical Exam  Nursing note and vitals reviewed. Constitutional: He is oriented to person, place, and time. He appears well-developed and well-nourished.  HENT:  Head: Normocephalic.  Eyes: Pupils are equal, round, and reactive to light.  Neck: Normal range of motion.  Cardiovascular: Normal rate and regular rhythm.   Pulmonary/Chest: Effort normal and breath sounds normal.  Abdominal: Soft.  Musculoskeletal: Normal range of motion. He exhibits no edema and no tenderness.  Neurological: He is alert and oriented to  person, place, and time.  No strength deficit noted.  Able to discern sharp and dull sensation to left hand.  Skin: Skin is warm and dry.  Psychiatric: He has a normal mood and affect. His behavior is normal. Judgment and thought content normal.    ED Course  Procedures (including critical care time) Labs Review Labs Reviewed - No data to display Imaging Review No results found.   EKG Interpretation None     Some mild tenderness along upper shoulder, stiffness of left side of neck.  Suspect radicular pain.  Anti-inflammatory, PCP follow-up. MDM   Final diagnoses:  Cervical radiculopathy        Jimmye Normanavid John Arrow Emmerich, NP 04/23/13 0011

## 2013-11-15 ENCOUNTER — Emergency Department (HOSPITAL_COMMUNITY)
Admission: EM | Admit: 2013-11-15 | Discharge: 2013-11-15 | Discharge status: Against Medical Advice | Payer: Self-pay | Attending: Emergency Medicine | Admitting: Emergency Medicine

## 2013-11-15 ENCOUNTER — Encounter (HOSPITAL_COMMUNITY): Payer: Self-pay | Admitting: Emergency Medicine

## 2013-11-15 DIAGNOSIS — R1012 Left upper quadrant pain: Secondary | ICD-10-CM | POA: Insufficient documentation

## 2013-11-15 DIAGNOSIS — R100 Acute abdomen: Secondary | ICD-10-CM | POA: Insufficient documentation

## 2013-11-15 DIAGNOSIS — Z720 Tobacco use: Secondary | ICD-10-CM | POA: Insufficient documentation

## 2013-11-15 DIAGNOSIS — K5900 Constipation, unspecified: Secondary | ICD-10-CM | POA: Insufficient documentation

## 2013-11-15 DIAGNOSIS — R42 Dizziness and giddiness: Secondary | ICD-10-CM | POA: Insufficient documentation

## 2013-11-15 LAB — CBC WITH DIFFERENTIAL/PLATELET
Basophils Absolute: 0 10*3/uL (ref 0.0–0.1)
Basophils Relative: 0 % (ref 0–1)
Eosinophils Absolute: 0.1 10*3/uL (ref 0.0–0.7)
Eosinophils Relative: 2 % (ref 0–5)
HCT: 40.4 % (ref 39.0–52.0)
Hemoglobin: 14.2 g/dL (ref 13.0–17.0)
Lymphocytes Relative: 47 % — ABNORMAL HIGH (ref 12–46)
Lymphs Abs: 3.4 10*3/uL (ref 0.7–4.0)
MCH: 30.3 pg (ref 26.0–34.0)
MCHC: 35.1 g/dL (ref 30.0–36.0)
MCV: 86.1 fL (ref 78.0–100.0)
Monocytes Absolute: 0.5 10*3/uL (ref 0.1–1.0)
Monocytes Relative: 6 % (ref 3–12)
Neutro Abs: 3.3 10*3/uL (ref 1.7–7.7)
Neutrophils Relative %: 45 % (ref 43–77)
Platelets: 249 10*3/uL (ref 150–400)
RBC: 4.69 MIL/uL (ref 4.22–5.81)
RDW: 12.7 % (ref 11.5–15.5)
WBC: 7.4 10*3/uL (ref 4.0–10.5)

## 2013-11-15 LAB — LIPASE, BLOOD: Lipase: 12 U/L (ref 11–59)

## 2013-11-15 LAB — COMPREHENSIVE METABOLIC PANEL
ALT: 11 U/L (ref 0–53)
AST: 20 U/L (ref 0–37)
Albumin: 4.4 g/dL (ref 3.5–5.2)
Alkaline Phosphatase: 67 U/L (ref 39–117)
Anion gap: 10 (ref 5–15)
BUN: 11 mg/dL (ref 6–23)
CO2: 30 mEq/L (ref 19–32)
Calcium: 9.4 mg/dL (ref 8.4–10.5)
Chloride: 100 mEq/L (ref 96–112)
Creatinine, Ser: 1.1 mg/dL (ref 0.50–1.35)
GFR calc Af Amer: >90 mL/min (ref >90)
GFR calc non Af Amer: 82 mL/min — ABNORMAL LOW (ref >90)
Glucose, Bld: 93 mg/dL (ref 70–99)
Potassium: 3.8 mEq/L (ref 3.7–5.3)
Sodium: 140 mEq/L (ref 137–147)
Total Bilirubin: 0.5 mg/dL (ref 0.3–1.2)
Total Protein: 7.6 g/dL (ref 6.0–8.3)

## 2013-11-15 NOTE — ED Notes (Signed)
Pt reports left side abd pain x 3 days and feeling slightly lightheaded. Denies any n/v/d but does feel constipated, unable to have a bowel movement today. No acute distress noted at triage.

## 2013-11-15 NOTE — ED Notes (Signed)
Pt up to nurse first- reports he needs to leave because his mother has to go to work and she drove him here- this RN encouraged pt to stay and be evaluated.  Pt states he has to leave but he will come back.

## 2013-11-15 NOTE — ED Notes (Signed)
Patient here with abdominal pain, denies nausea and vomiting at this time.  Patient was triaged earlier and blood drawn at that time.  Patient had to leave due to family obligations and came back to be examined.  Patient states that his pain is on the upper left quadrant.

## 2013-11-16 ENCOUNTER — Emergency Department (HOSPITAL_COMMUNITY)
Admission: EM | Admit: 2013-11-16 | Discharge: 2013-11-16 | Disposition: A | Discharge status: Home / Self Care | Payer: Self-pay | Attending: Emergency Medicine | Admitting: Emergency Medicine

## 2013-11-16 DIAGNOSIS — R1012 Left upper quadrant pain: Secondary | ICD-10-CM

## 2013-11-16 MED ORDER — DICYCLOMINE HCL 20 MG PO TABS: 20.0000 mg | ORAL_TABLET | Freq: Four times a day (QID) | ORAL | Status: DC | PRN

## 2013-11-16 MED ORDER — SIMETHICONE 80 MG PO CHEW: 80.0000 mg | CHEWABLE_TABLET | Freq: Four times a day (QID) | ORAL | Status: DC | PRN

## 2013-11-16 NOTE — ED Provider Notes (Signed)
CSN: [id number]     Arrival date & time 11/15/13  2131 History   First MD Initiated Contact with Patient 11/16/13 0257     Chief Complaint  Patient presents with  . Abdominal Pain     (Consider location/radiation/quality/duration/timing/severity/associated sxs/prior Treatment) HPI 38 yo male presents to the ER with complaint of LUQ pain off and on for the last 3 days.  No n/v/d.  Pain is described as dull and crampy.  Pt with some mild constipation yesterday.  No prior abdominal surgeries, no medical problems. History reviewed. No pertinent past medical history. History reviewed. No pertinent past surgical history. No family history on file. History  Substance Use Topics  . Smoking status: Current Every Day Smoker  . Smokeless tobacco: Not on file  . Alcohol Use: No    Review of Systems  All other systems reviewed and are negative.     Allergies  Review of patient's allergies indicates no known allergies.  Home Medications   Prior to Admission medications   Medication Sig Start Date End Date Taking? Authorizing Provider  dicyclomine (BENTYL) 20 MG tablet Take 1 tablet (20 mg total) by mouth every 6 (six) hours as needed for spasms (for abdominal cramping). 11/16/13   Olivia Mackielga M Sharmane Dame, MD  simethicone (GAS-X) 80 MG chewable tablet Chew 1 tablet (80 mg total) by mouth every 6 (six) hours as needed for flatulence. 11/16/13   Olivia Mackielga M Akasha Melena, MD   BP 114/81  Pulse 58  Temp(Src) 98.1 F (36.7 C)  Resp 18  SpO2 98% Physical Exam  Nursing note and vitals reviewed. Constitutional: He is oriented to person, place, and time. He appears well-developed and well-nourished.  HENT:  Head: Normocephalic and atraumatic.  Nose: Nose normal.  Mouth/Throat: Oropharynx is clear and moist.  Eyes: Conjunctivae and EOM are normal. Pupils are equal, round, and reactive to light.  Neck: Normal range of motion. Neck supple. No JVD present. No tracheal deviation present. No thyromegaly present.   Cardiovascular: Normal rate, regular rhythm, normal heart sounds and intact distal pulses.  Exam reveals no gallop and no friction rub.   No murmur heard. Pulmonary/Chest: Effort normal and breath sounds normal. No stridor. No respiratory distress. He has no wheezes. He has no rales. He exhibits no tenderness.  Abdominal: Soft. Bowel sounds are normal. He exhibits no distension and no mass. There is no tenderness. There is no rebound and no guarding.  Musculoskeletal: Normal range of motion. He exhibits no edema and no tenderness.  Lymphadenopathy:    He has no cervical adenopathy.  Neurological: He is alert and oriented to person, place, and time. He displays normal reflexes. He exhibits normal muscle tone. Coordination normal.  Skin: Skin is warm and dry. No rash noted. No erythema. No pallor.  Psychiatric: He has a normal mood and affect. His behavior is normal. Judgment and thought content normal.    ED Course  Procedures (including critical care time) Labs Review Labs Reviewed - No data to display  Imaging Review No results found.   EKG Interpretation None      MDM   Final diagnoses:  LUQ abdominal pain    38 yo male with intermittent LUQ pain for the last 2-3 days.  No pain at present.  No associated symptoms other than mild constipation.  Labs unremarkable, no urine obtained and patient not willing to stay.  Exam normal.     Olivia Mackielga M Cashis Rill, MD 11/17/13 [phone]

## 2013-11-16 NOTE — ED Notes (Signed)
Al[place]ert, NAD, calm, interactive, resps e/u, speaking in clear compelte sentences, VSS, c/o abd pain, onset yesterday, last BM 2d ago, describes as constipated, usually has 2 BMs p/day, (denies: nvd, fever, bleeding, sob or back pain), declines IV at this time, no meds PTA, ate [employer] ~1 hr ago. No change in pain.

## 2013-11-16 NOTE — Discharge Instructions (Signed)
Abdominal Pain °Many things can cause abdominal pain. Usually, abdominal pain is not caused by a disease and will improve without treatment. It can often be observed and treated at home. Your health care provider will do a physical exam and possibly order blood tests and X-rays to help determine the seriousness of your pain. However, in many cases, more time must pass before a clear cause of the pain can be found. Before that point, your health care provider may not know if you need more testing or further treatment. °HOME CARE INSTRUCTIONS  °Monitor your abdominal pain for any changes. The following actions may help to alleviate any discomfort you are experiencing: °· Only take over-the-counter or prescription medicines as directed by your health care provider. °· Do not take laxatives unless directed to do so by your health care provider. °· Try a clear liquid diet (broth, tea, or water) as directed by your health care provider. Slowly move to a bland diet as tolerated. °SEEK MEDICAL CARE IF: °· You have unexplained abdominal pain. °· You have abdominal pain associated with nausea or diarrhea. °· You have pain when you urinate or have a bowel movement. °· You experience abdominal pain that wakes you in the night. °· You have abdominal pain that is worsened or improved by eating food. °· You have abdominal pain that is worsened with eating fatty foods. °· You have a fever. °SEEK IMMEDIATE MEDICAL CARE IF:  °· Your pain does not go away within 2 hours. °· You keep throwing up (vomiting). °· Your pain is felt only in portions of the abdomen, such as the right side or the left lower portion of the abdomen. °· You pass bloody or black tarry stools. °MAKE SURE YOU: °· Understand these instructions.   °· Will watch your condition.   °· Will get help right away if you are not doing well or get worse.   °Document Released: 11/03/2004 Document Revised: 01/29/2013 Document Reviewed: 10/03/2012 °[employer]® Patient Information  ©2015 [employer]. This information is not intended to replace advice given to you by your health care provider. Make sure you discuss any questions you have with your health care provider. ° °Pain of Unknown Etiology (Pain Without a Known Cause) °You have come to your caregiver because of pain. Pain can occur in any part of the body. Often there is not a definite cause. If your laboratory (blood or urine) work was normal and X-rays or other studies were normal, your caregiver may treat you without knowing the cause of the pain. An example of this is the headache. Most headaches are diagnosed by taking a history. This means your caregiver asks you questions about your headaches. Your caregiver determines a treatment based on your answers. Usually testing done for headaches is normal. Often testing is not done unless there is no response to medications. Regardless of where your pain is located today, you can be given medications to make you comfortable. If no physical cause of pain can be found, most cases of pain will gradually leave as suddenly as they came.  °If you have a painful condition and no reason can be found for the pain, it is important that you follow up with your caregiver. If the pain becomes worse or does not go away, it may be necessary to repeat tests and look further for a possible cause. °· Only take over-the-counter or prescription medicines for pain, discomfort, or fever as directed by your caregiver. °· For the protection of your privacy, test results   cannot be given over the phone. Make sure you receive the results of your test. Ask how these results are to be obtained if you have not been informed. It is your responsibility to obtain your test results. °· You may continue all activities unless the activities cause more pain. When the pain lessens, it is important to gradually resume normal activities. Resume activities by beginning slowly and gradually increasing the intensity and duration  of the activities or exercise. During periods of severe pain, bed rest may be helpful. Lie or sit in any position that is comfortable. °· Ice used for acute (sudden) conditions may be effective. Use a large plastic bag filled with ice and wrapped in a towel. This may provide pain relief. °· See your caregiver for continued problems. Your caregiver can help or refer you for exercises or physical therapy if necessary. °If you were given medications for your condition, do not drive, operate machinery or power tools, or sign legal documents for 24 hours. Do not drink alcohol, take sleeping pills, or take other medications that may interfere with treatment. °See your caregiver immediately if you have pain that is becoming worse and not relieved by medications. °Document Released: 10/19/2000 Document Revised: 11/14/2012 Document Reviewed: 01/24/2005 °Exit[employer]® Patient Information ©2015 [employer]. This information is not intended to replace advice given to you by your health care provider. Make sure you discuss any questions you have with your health care provider. ° °

## 2013-11-16 NOTE — ED Notes (Signed)
[facility]

## 2014-05-23 ENCOUNTER — Emergency Department (HOSPITAL_COMMUNITY)
Admission: EM | Admit: 2014-05-23 | Discharge: 2014-05-23 | Disposition: A | Discharge status: Home / Self Care | Payer: No Typology Code available for payment source | Attending: Emergency Medicine | Admitting: Emergency Medicine

## 2014-05-23 ENCOUNTER — Emergency Department (HOSPITAL_COMMUNITY): Payer: No Typology Code available for payment source

## 2014-05-23 ENCOUNTER — Encounter (HOSPITAL_COMMUNITY): Payer: Self-pay | Admitting: Emergency Medicine

## 2014-05-23 DIAGNOSIS — S90122A Contusion of left lesser toe(s) without damage to nail, initial encounter: Secondary | ICD-10-CM | POA: Insufficient documentation

## 2014-05-23 DIAGNOSIS — Y9389 Activity, other specified: Secondary | ICD-10-CM | POA: Insufficient documentation

## 2014-05-23 DIAGNOSIS — Z720 Tobacco use: Secondary | ICD-10-CM | POA: Diagnosis not present

## 2014-05-23 DIAGNOSIS — Z79899 Other long term (current) drug therapy: Secondary | ICD-10-CM | POA: Insufficient documentation

## 2014-05-23 DIAGNOSIS — Y998 Other external cause status: Secondary | ICD-10-CM | POA: Diagnosis not present

## 2014-05-23 DIAGNOSIS — X58XXXA Exposure to other specified factors, initial encounter: Secondary | ICD-10-CM | POA: Diagnosis not present

## 2014-05-23 DIAGNOSIS — Y9367 Activity, basketball: Secondary | ICD-10-CM | POA: Insufficient documentation

## 2014-05-23 DIAGNOSIS — Y92838 Other recreation area as the place of occurrence of the external cause: Secondary | ICD-10-CM | POA: Insufficient documentation

## 2014-05-23 DIAGNOSIS — S99922A Unspecified injury of left foot, initial encounter: Secondary | ICD-10-CM | POA: Diagnosis present

## 2014-05-23 MED ORDER — NAPROXEN 500 MG PO TABS: 500.0000 mg | ORAL_TABLET | Freq: Two times a day (BID) | ORAL | Status: DC

## 2014-05-23 NOTE — ED Notes (Signed)
'  stumped' left great toe while playing bball last pm. C/o pain at base of great toe. Redness/swelling noted at distal phalanx.

## 2014-05-23 NOTE — ED Notes (Signed)
Post op boot placed

## 2014-05-23 NOTE — Discharge Instructions (Signed)
Please read and follow all provided instructions.  Your diagnoses today include:  1. Contusion of toe, left, initial encounter    Tests performed today include:  An x-ray of the affected area - does NOT show any broken bones  Vital signs. See below for your results today.   Medications prescribed:   Naproxen - anti-inflammatory pain medication  Do not exceed 500mg  naproxen every 12 hours, take with food  You have been prescribed an anti-inflammatory medication or NSAID. Take with food. Take smallest effective dose for the shortest duration needed for your pain. Stop taking if you experience stomach pain or vomiting.   Take any prescribed medications only as directed.  Home care instructions:   Follow any educational materials contained in this packet  Follow R.I.C.E. Protocol:  R - rest your injury   I  - use ice on injury without applying directly to skin  C - compress injury with bandage or splint  E - elevate the injury as much as possible  Follow-up instructions: Please follow-up with your primary care provider if you continue to have significant pain in 1 week. In this case you may have a more severe injury that requires further care.   Return instructions:   Please return if your toes or feet are numb or tingling, appear gray or blue, or you have severe pain (also elevate the leg and loosen splint or wrap if you were given one)  Please return to the Emergency Department if you experience worsening symptoms.   Please return if you have any other emergent concerns.  Additional Information:  Your vital signs today were: BP 123/82 mmHg   Pulse 66   Temp(Src) 98.5 F (36.9 C) (Oral)   Resp 16   Ht 6' (1.829 m)   Wt 180 lb (81.647 kg)   BMI 24.41 kg/m2   SpO2 100% If your blood pressure (BP) was elevated above 135/85 this visit, please have this repeated by your doctor within one month. --------------

## 2014-05-23 NOTE — ED Provider Notes (Signed)
CSN: [id number]     Arrival date & time 05/23/14  1191 History  This chart was scribed for non-physician practitioner, Renne Crigler, PA-C working with [name], DO by Gwenyth Ober, ED scribe. This patient was seen in room TR09C/TR09C and the patient's care was started at 11:06 AM   Chief Complaint  Patient presents with  . Toe Injury   The history is provided by the patient. No language interpreter was used.   HPI Comments: Bryce Baxter is a 39 y.o. male who presents to the Emergency Department complaining of constant, moderate great left toe pain that started last night while he was playing basketball. He states that he 'stubbed' his toe. He states swelling as an associated symptom. Pt reports pain becomes worse with bearing weight. He denies numbness or weakness as associated symptoms. He is able to bear weight but with a limp.   History reviewed. No pertinent past medical history. No past surgical history on file. No family history on file. History  Substance Use Topics  . Smoking status: Current Every Day Smoker  . Smokeless tobacco: Not on file  . Alcohol Use: No    Review of Systems  Constitutional: Negative for activity change.  Musculoskeletal: Positive for joint swelling, arthralgias and gait problem. Negative for back pain and neck pain.  Skin: Negative for wound.  Neurological: Negative for weakness and numbness.      Allergies  Review of patient's allergies indicates no known allergies.  Home Medications   Prior to Admission medications   Medication Sig Start Date End Date Taking? Authorizing Provider  dicyclomine (BENTYL) 20 MG tablet Take 1 tablet (20 mg total) by mouth every 6 (six) hours as needed for spasms (for abdominal cramping). 11/16/13   Marisa Severin, MD  simethicone (GAS-X) 80 MG chewable tablet Chew 1 tablet (80 mg total) by mouth every 6 (six) hours as needed for flatulence. 11/16/13   Marisa Severin, MD   BP 123/82 mmHg  Pulse 66  Temp(Src)  98.5 F (36.9 C) (Oral)  Resp 16  Ht 6' (1.829 m)  Wt 180 lb (81.647 kg)  BMI 24.41 kg/m2  SpO2 100% Physical Exam  Constitutional: He appears well-developed and well-nourished. No distress.  HENT:  Head: Normocephalic and atraumatic.  Eyes: Conjunctivae and EOM are normal.  Neck: Normal range of motion. Neck supple. No tracheal deviation present.  Cardiovascular: Normal rate and normal pulses.   Pulmonary/Chest: Effort normal. No respiratory distress.  Musculoskeletal: He exhibits tenderness. He exhibits no edema.  Generalized swelling of the left great toe from the MTP distally. Patient would generalized, non-localized, tenderness in the same area. Good active range of motion with pain.  Neurological: He is alert. No sensory deficit.  Motor, sensation, and vascular distal to the injury is fully intact.   Skin: Skin is warm and dry.  Onychomycosis noted  Psychiatric: He has a normal mood and affect. His behavior is normal.  Nursing note and vitals reviewed.   ED Course  Procedures   DIAGNOSTIC STUDIES: Oxygen Saturation is 100% on RA, normal by my interpretation.    COORDINATION OF CARE: 11:08 AM Discussed x-ray results and treatment plan which includes the RICE method and a post-op shoe. Pt agreed to plan.   Labs Review Labs Reviewed - No data to display  Imaging Review Dg Toe Great Left  05/23/2014   CLINICAL DATA:  Toe injury, basketball injury last night, left great toe pain  EXAM: LEFT GREAT TOE  COMPARISON:  None.  FINDINGS: Three views of the left great toe submitted. No acute fracture or subluxation. No radiopaque foreign body.  IMPRESSION: Negative.   Electronically Signed   By: Natasha MeadLiviu  Pop M.D.   On: 05/23/2014 10:36     EKG Interpretation None       Vital signs reviewed and are as follows: Filed Vitals:   05/23/14 1117  BP: 103/81  Pulse: 66  Temp: 97.7 F (36.5 C)  Resp: 12   Patient was counseled on RICE protocol and told to rest injury, use ice  for no longer than 15 minutes every hour, compress the area, and elevate above the level of their heart as much as possible to reduce swelling. Questions answered. Patient verbalized understanding.    Post-op shoe by nurse.    MDM   Final diagnoses:  Contusion of toe, left, initial encounter   Contusion of toe, negative x-rays. Toe neurovascularly intact. No other proximal injuries.  I personally performed the services described in this documentation, which was scribed in my presence. The recorded information has been reviewed and is accurate.    Renne CriglerJoshua Gabreil [place], PA-C 05/23/14 1128  Samuel JesterKathleen McManus, DO 05/24/14 [phone]

## 2014-05-24 ENCOUNTER — Encounter (HOSPITAL_COMMUNITY): Payer: Self-pay | Admitting: Emergency Medicine

## 2014-05-24 ENCOUNTER — Emergency Department (HOSPITAL_COMMUNITY)
Admission: EM | Admit: 2014-05-24 | Discharge: 2014-05-24 | Disposition: A | Discharge status: Home / Self Care | Payer: No Typology Code available for payment source | Attending: Emergency Medicine | Admitting: Emergency Medicine

## 2014-05-24 DIAGNOSIS — M109 Gout, unspecified: Secondary | ICD-10-CM

## 2014-05-24 DIAGNOSIS — Z720 Tobacco use: Secondary | ICD-10-CM | POA: Diagnosis not present

## 2014-05-24 DIAGNOSIS — M1A0720 Idiopathic chronic gout, left ankle and foot, without tophus (tophi): Secondary | ICD-10-CM | POA: Insufficient documentation

## 2014-05-24 DIAGNOSIS — Z791 Long term (current) use of non-steroidal anti-inflammatories (NSAID): Secondary | ICD-10-CM | POA: Diagnosis not present

## 2014-05-24 DIAGNOSIS — M79675 Pain in left toe(s): Secondary | ICD-10-CM | POA: Diagnosis present

## 2014-05-24 LAB — I-STAT CHEM 8, ED
BUN: 8 mg/dL (ref 6–23)
CALCIUM ION: 1.24 mmol/L — AB (ref 1.12–1.23)
CHLORIDE: 99 mmol/L (ref 96–112)
CREATININE: 1.1 mg/dL (ref 0.50–1.35)
GLUCOSE: 83 mg/dL (ref 70–99)
HEMATOCRIT: 46 % (ref 39.0–52.0)
Hemoglobin: 15.6 g/dL (ref 13.0–17.0)
Potassium: 3.9 mmol/L (ref 3.5–5.1)
SODIUM: 141 mmol/L (ref 135–145)
TCO2: 27 mmol/L (ref 0–100)

## 2014-05-24 MED ORDER — COLCHICINE 0.6 MG PO TABS
1.2000 mg | ORAL_TABLET | ORAL | Status: AC
  Administered 2014-05-24: 1.2 mg via ORAL
  Filled 2014-05-24: qty 2

## 2014-05-24 MED ORDER — INDOMETHACIN 25 MG PO CAPS
25.0000 mg | ORAL_CAPSULE | Freq: Three times a day (TID) | ORAL | Status: DC | PRN
Start: 2014-05-24 — End: 2015-06-30

## 2014-05-24 MED ORDER — TRAMADOL HCL 50 MG PO TABS: 50.0000 mg | ORAL_TABLET | Freq: Four times a day (QID) | ORAL | Status: DC | PRN

## 2014-05-24 MED ORDER — HYDROCODONE-ACETAMINOPHEN 5-325 MG PO TABS
1.0000 | ORAL_TABLET | Freq: Once | ORAL | Status: AC
  Administered 2014-05-24: 1 via ORAL
  Filled 2014-05-24: qty 1

## 2014-05-24 NOTE — ED Provider Notes (Signed)
CSN: [id number]     Arrival date & time 05/24/14  1309 History  This chart was scribed for Bryce Battiest, NP, working with Bryce Jester, DO by Chestine Spore, ED Scribe. The patient was seen in room TR07C/TR07C at 2:17 PM.    Chief Complaint  Patient presents with  . Toe Pain    The history is provided by the patient. No language interpreter was used.   HPI Comments: Bryce Baxter is a 39 y.o. male who presents to the Emergency Department complaining of toe pain onset 3 days. Pt was seen yesterday for a toe injury that occurred while he was playing basketball and he stubbed his toe. Pt was given Rx for naproxen that has not relieved his pain. Pt reports that there is pain when he tries to walk on the affected foot. The pain began 20 minutes after he left from playing basketball and that there was no prior pain. Pt has never been dx with gout. He states that he is having associated symptoms of joint swelling, redness. He states that he has tried naprosyn with no relief for his symptoms. He denies penile discharge, STD, discharge from the affected foot and any other symptoms. Pt denies kidney issues.    History reviewed. No pertinent past medical history. History reviewed. No pertinent past surgical history. No family history on file. History  Substance Use Topics  . Smoking status: Current Every Day Smoker  . Smokeless tobacco: Not on file  . Alcohol Use: No    Review of Systems  Genitourinary: Negative for discharge.  Musculoskeletal: Positive for joint swelling and arthralgias.  Skin: Positive for color change. Negative for rash.       No discharge from the left big toe      Allergies  Review of patient's allergies indicates no known allergies.  Home Medications   Prior to Admission medications   Medication Sig Start Date End Date Taking? Authorizing Provider  dicyclomine (BENTYL) 20 MG tablet Take 1 tablet (20 mg total) by mouth every 6 (six) hours as needed for  spasms (for abdominal cramping). 11/16/13   Marisa Severin, MD  naproxen (NAPROSYN) 500 MG tablet Take 1 tablet (500 mg total) by mouth 2 (two) times daily. 05/23/14   Renne Crigler, PA-C  simethicone (GAS-X) 80 MG chewable tablet Chew 1 tablet (80 mg total) by mouth every 6 (six) hours as needed for flatulence. 11/16/13   Marisa Severin, MD   BP 122/79 mmHg  Pulse 53  Temp(Src) 99 F (37.2 C) (Oral)  Resp 16  Ht 6' (1.829 m)  Wt 182 lb (82.555 kg)  BMI 24.68 kg/m2  SpO2 97%  Physical Exam  Constitutional: He is oriented to person, place, and time. He appears well-developed and well-nourished. No distress.  HENT:  Head: Normocephalic and atraumatic.  Eyes: Conjunctivae are normal. Right eye exhibits no discharge. Left eye exhibits no discharge. No scleral icterus.  Neck: Neck supple. No tracheal deviation present.  Cardiovascular: Normal rate and intact distal pulses.   Pulmonary/Chest: Effort normal. No respiratory distress.  Musculoskeletal: Normal range of motion.  Redness and warmth to the left MCP joint. No discharge or drainage noted, no fluctuance to the reddened skin noted.  Neurological: He is alert and oriented to person, place, and time. Coordination normal.  Skin: Skin is warm and dry. He is not diaphoretic.  Psychiatric: He has a normal mood and affect. His behavior is normal.  Nursing note and vitals reviewed.   ED Course  Procedures (  including critical care time) DIAGNOSTIC STUDIES: Oxygen Saturation is 97% on RA, nl by my interpretation.    COORDINATION OF CARE: 2:24 PM-Discussed treatment plan which includes d/c naprosyn, pain medication Rx, referral to orthopedist, f/u with PCP with pt at bedside and pt agreed to plan.   Labs Review Labs Reviewed  I-STAT CHEM 8, ED    Imaging Review Dg Toe Great Left  05/23/2014   CLINICAL DATA:  Toe injury, basketball injury last night, left great toe pain  EXAM: LEFT GREAT TOE  COMPARISON:  None.  FINDINGS: Three views of the  left great toe submitted. No acute fracture or subluxation. No radiopaque foreign body.  IMPRESSION: Negative.   Electronically Signed   By: Natasha MeadLiviu  Pop M.D.   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 Pt reports the pain began after playing basketball so he assumed it was related to an injury but he cannot recall an actual injury.  He was seen for the same yesterday but the pain is worse, his x-ray at that visit was negative for fracture.  He is afebrile and and without any systemic signs of infection. Renal function good. Pt without known peptic ulcer disease and not receiving concurrent treatment on warfarin. Pt is well-appearing, in no acute distress and vital signs reviewed and not concerning. He appears safe to be discharged.  Discharge include resources to follow-up with a PCP.  Pt dc with indomethacin (25 mg PO TID). Discussed that pt should respond to treatment with in 24 hour of begining treatment & likely resolve in 2-3 days.  Return precautions provided. Pt aware of plan and in agreement.   I personally performed the services described in this documentation, which was scribed in my presence. The recorded information has been reviewed and is accurate.  Filed Vitals:   05/24/14 1321 05/24/14 1507  BP: 122/79 118/70  Pulse: 53 59  Temp: 99 F (37.2 C) 98.3 F (36.8 C)  TempSrc: Oral Oral  Resp: 16 16  Height: 6' (1.829 m)   Weight: 182 lb (82.555 kg)   SpO2: 97% 100%   Meds given in ED:  Medications  HYDROcodone-acetaminophen (NORCO/VICODIN) 5-325 MG per tablet 1 tablet (1 tablet Oral Given 05/24/14 1434)  colchicine tablet 1.2 mg (1.2 mg Oral Given 05/24/14 1508)    Discharge Medication List as of 05/24/2014  3:04 PM    START taking these medications   Details  indomethacin (INDOCIN) 25 MG capsule Take 1 capsule (25 mg total)  by mouth 3 (three) times daily as needed., Starting 05/24/2014, Until Discontinued, Print    traMADol (ULTRAM) 50 MG tablet Take 1 tablet (50 mg total) by mouth every 6 (six) hours as needed., Starting 05/24/2014, Until Discontinued, Print          Bryce BattiestElizabeth Arta Stump, NP 05/24/14 1758  Bryce JesterKathleen McManus, DO 05/25/14 1355

## 2014-05-24 NOTE — ED Notes (Signed)
Declined W/C at D/C and was escorted to lobby by RN. 

## 2014-05-24 NOTE — ED Notes (Addendum)
Pt reports he needs pain control for toe pain( Lt great). Pt reports Naproxen has not relieved the pain . Pt took a total of 3 Naproxen pills.

## 2014-05-24 NOTE — ED Notes (Signed)
Pt. Stated, I was here yesterday for the same problem , toe pain for 3 days.  Unable to work.

## 2014-05-24 NOTE — Discharge Instructions (Signed)
Please follow the directions provided.  Use the referrals or the resource guide provided to establish care with a primary care doctor for further management of this toe pain. Please start taking the indomethacin 3 times a day to help with this pain. You may also use the tramadol for pain not relieved by the indomethacin. Be sure to drink plenty of fluids to stay well hydrated. Don't hesitate to return for any new, worsening, or concerning symptoms.   SEEK MEDICAL CARE IF:  You develop diarrhea, vomiting, or any side effects from medicines.  You do not feel better in 24 hours, or you are getting worse. SEEK IMMEDIATE MEDICAL CARE IF:  Your joint becomes suddenly more tender, and you have chills or a fever.   Emergency Department Resource Guide 1) Find a Doctor and Pay Out of Pocket Although you won't have to find out who is covered by your insurance plan, it is a good idea to ask around and get recommendations. You will then need to call the office and see if the doctor you have chosen will accept you as a new patient and what types of options they offer for patients who are self-pay. Some doctors offer discounts or will set up payment plans for their patients who do not have insurance, but you will need to ask so you aren't surprised when you get to your appointment.  2) Contact Your Local Health Department Not all health departments have doctors that can see patients for sick visits, but many do, so it is worth a call to see if yours does. If you don't know where your local health department is, you can check in your phone book. The CDC also has a tool to help you locate your state's health department, and many state websites also have listings of all of their local health departments.  3) Find a Walk-in Clinic If your illness is not likely to be very severe or complicated, you may want to try a walk in clinic. These are popping up all over the country in pharmacies, drugstores, and shopping  centers. They're usually staffed by nurse practitioners or physician assistants that have been trained to treat common illnesses and complaints. They're usually fairly quick and inexpensive. However, if you have serious medical issues or chronic medical problems, these are probably not your best option.  No Primary Care Doctor: - Call Health Connect at  [phone] - they can help you locate a primary care doctor that  accepts your insurance, provides certain services, etc. - Physician Referral Service- [phone]  Chronic Pain Problems: Organization         Address  Phone   Notes  [facility]  [phone] Patients need to be referred by their primary care doctor.   Medication Assistance: Organization         Address  Phone   Notes  [facility] Medication [facility] [address] [place], [place] [phone] --Must be a resident of [facility] [place] -- Must have NO insurance coverage whatsoever (no Medicaid/ Medicare, etc.) -- The pt. MUST have a primary care doctor that directs their care regularly and follows them in the community   MedAssist  [phone]   [employer]  [phone]    Agencies that provide inexpensive medical care: Organization         Address  Phone   Notes  [facility]  [phone]   [facility]    (  [phone]   [facility] [place], [place] [address] [phone]   [place] [address] [place] [phone]   Planned Parenthood    [phone]   [facility]    [phone]   [place] and [address], [place] Phone:  [phone], Fax:  [phone] Hours of Operation:  9 am - 6 pm, M-F.  Also accepts Medicaid/Medicare and self-pay.  [facility] for [place], [address], [place] Phone: [phone], Fax: [phone]. Hours of Operation:  8:30 am - 5:30 pm, M-F.  Also accepts Medicaid and self-pay.  [facility] [place] [address], [place] Phone: [phone]   [place], [place], [place] [phone], Ext. [phone] Mondays & Thursdays: 7-9 AM.  First 15 patients are seen on a first come, first serve basis.    [place] Providers:  Organization         Address  Phone   Notes  [facility] [address], [place] [phone] Also accepts self-pay patients.  [facility] [id number] [place], [place]  [phone]   [place], [address], [place] [phone]   [facility] [address], [place] [phone]   Lucianne Lei [address], [place]   [phone] Only accepts [place] Access [place] patients after they have their name applied to their card.   Self-Pay (no insurance) in [facility]:  Organization         Address  Phone   Notes  Sickle Cell Patients, [facility] Magazine [phone]   [facility] [place] [phone]   [facility] [place]  [place], [place], [place] [phone]   [facility]/Dr. Osei-Bonsu  [address] [place] or [address], [place] [phone] Phone number for both [place] and [place] locations is the same.  Urgent Medical and [facility] [address], [place] [phone]   [facility] [address] [place] or [address] [phone]   [facility] [address], [place] [phone], phone; [phone], fax Sees patients 1st and 3rd Saturday of every month.  Must not qualify for public or private insurance (i.e.  Medicaid, Medicare, [place] Health Choice, Veterans' Benefits)  Household income should be no more than 200% of the poverty level The clinic cannot treat you if you are pregnant or think you are pregnant  Sexually transmitted diseases are not treated at the clinic.    Dental Care: Organization         Address  Phone  Notes  [facility] Department of [facility] [place] [phone] Accepts children up to age 54 who are enrolled in [place] or [place]; pregnant women with a Medicaid card; and children who have applied for Medicaid or  Health Choice, but were declined, whose parents can pay a reduced fee at time of service.  [facility] Department of [facility]  [address], [place] [phone] Accepts children up  to age 84 who are enrolled in Medicaid or [place] Health Choice; pregnant women with a Medicaid card; and children who have applied for Medicaid or [place] Health Choice, but were declined, whose parents can pay a reduced fee at time of service.  [place] Adult Dental Access PROGRAM  Fellows [phone] Patients are seen by appointment only. Walk-ins are not accepted. [place] will see patients 9 years of age and older. Monday - Tuesday (8am-5pm) Most Wednesdays (8:30-5pm) $30 per visit, cash only  [facility] Adult Dental Access PROGRAM  [address], [facility] [phone] Patients are seen by appointment only. Walk-ins are not accepted. [place] will see patients 65 years of age and older. One Wednesday Evening (Monthly: Volunteer Based).  $30 per visit, cash only  [place]  [phone] for adults; Children under age 30, call Graduate Pediatric Dentistry at [phone]. Children aged 55-14, please call [phone] to request a pediatric application.  Dental services are provided in all areas of dental care including fillings,  crowns and bridges, complete and partial dentures, implants, gum treatment, root canals, and extractions. Preventive care is also provided. Treatment is provided to both adults and children. Patients are selected via a lottery and there is often a waiting list.   [facility] [address] [place]  [phone] [email or web address]   [facility] [address] [place], [place] [phone], Ext. [phone] Second and Fourth Thursday of each month, opens at 6:30 AM; Clinic ends at 9 AM.  Patients are seen on a first-come first-served basis, and a limited number are seen during each clinic.   [facility]  [address] [place], [place] [phone]   Eligibility Requirements You must have lived in [place], [place], or [place] counties for at least the last three months.   You cannot be eligible for state or federal sponsored [employer], including [employer], [place], or [employer].   You generally cannot be eligible for healthcare insurance through your employer.    How to apply: Eligibility screenings are held every Tuesday and Wednesday afternoon from 1:00 pm until 4:00 pm. You do not need an appointment for the interview!  [facility] [address] [place], [place]   [place]  Brecon Department  [place]  [phone]    Behavioral Health Resources in the Community: Intensive Outpatient Programs Organization         Address  Phone  Notes  [facility] [address] [place], [place] [phone]   [facility] [address], [place], [place]   ADS: Alcohol & Drug Svcs [address], [place], [place]    [place] [address]  [place], [place] or [phone]   Substance Abuse  Resources Organization         Address  Phone  Notes  Alcohol and Drug Services  [phone]   [place]  [phone]   [facility] [place]   Chinita Pester  [phone]   Residential & Outpatient Substance Abuse Program  [phone]   Psychological Services Organization         Address  Phone  Notes  [place]  [place]  [place]   [place]  [address] [place] or [phone]    Mobile Crisis Teams Organization         Address  Phone  Notes  [facility], Mobile Crisis Care Unit  [phone]   [facility]  [address] [place], [place]   [place] [address] [place] [phone]    Self-Help/Support Groups Organization         Address  Phone             Notes  [place]. of [place] - variety of support groups  [place] Call for more information  Narcotics Anonymous (NA), [facility] [address], [employer] [place]  2 meetings at this location   Special educational needs teacher         Address  Phone  Notes  [facility] Residential Treatment [place],    [place]  [phone]   [facility]  [address] [place] [phone], [place], [place]   [place], [place] [phone] Admissions: 8am-3pm M-F  Incentives Substance [place] [address],    [place], [place] [phone]   [facility] [address] [place], [place], [place]   [facility] [address],  [place], [place]   [facility] Programs - Intensive Outpatient [address], [place], [place] Walk   [facility] ([place] [address] [place].,  [place], [place] [phone] or [phone]   [facility] (RTS) [address] [place], [place] Accepts Medicaid  [facility] [place] [address],  [place] [phone] Substance Abuse/Addiction Treatment   [facility] Organization         Address  Phone  Notes  [facility]  [phone]   Domenic Schwab, PhD [address] Arlis [place], [place]   [phone] or [phone]   [facility] [place], [place] [phone]   [facility] [address] [place], [place] [phone] Insurance/Medicaid/sponsorship through [facility] and Families [address]                                    [place], [place] [phone] [place] [address], [place] [phone]    Dr. Adele Schilder  [phone]   [facility] Dept. 1) [address], [place] 2) [place] 3)  [place] [address], [place] [phone]  [phone]   [place] [phone] or [phone] (After Hours)

## 2014-08-24 ENCOUNTER — Encounter (HOSPITAL_COMMUNITY): Payer: Self-pay

## 2014-08-24 ENCOUNTER — Emergency Department (HOSPITAL_COMMUNITY)
Admission: EM | Admit: 2014-08-24 | Discharge: 2014-08-24 | Disposition: A | Discharge status: Home / Self Care | Payer: No Typology Code available for payment source | Attending: Emergency Medicine | Admitting: Emergency Medicine

## 2014-08-24 DIAGNOSIS — Z720 Tobacco use: Secondary | ICD-10-CM | POA: Insufficient documentation

## 2014-08-24 DIAGNOSIS — M25532 Pain in left wrist: Secondary | ICD-10-CM | POA: Insufficient documentation

## 2014-08-24 MED ORDER — ACETAMINOPHEN 325 MG PO TABS: 650.0000 mg | ORAL_TABLET | Freq: Once | ORAL | Status: DC

## 2014-08-24 MED ORDER — NAPROXEN 250 MG PO TABS: 250.0000 mg | ORAL_TABLET | Freq: Two times a day (BID) | ORAL | Status: DC

## 2014-08-24 NOTE — ED Notes (Signed)
He c/o pain in left wrist, plus difficulty grasping things and lifting things.  This has been gradually occurring and worsening x ~2 years.

## 2014-08-24 NOTE — ED Provider Notes (Signed)
CSN: [id number]     Arrival date & time 08/24/14  1226 History   First MD Initiated Contact with Patient 08/24/14 1306     Chief Complaint  Patient presents with  . Wrist Pain    Bryce Baxter is a 39 y.o. right hand dominant male who presents to the ED complaining of left generalized wrist pain ongoing for the past 2 years and gradually worsening. Patient reports he's had left wrist pain the past 2 years which is gradually worsening. He complains of 10/0 left wrist pain currently. He denies any injury or trauma to his left wrist. Patient reports he uses his wrists frequently at work and this seems to aggravate his pain. He denies any numbness, tingling or weakness. Denies any swelling, or injury. He has taken nothing for treatment today.  (Consider location/radiation/quality/duration/timing/severity/associated sxs/prior Treatment) HPI  History reviewed. No pertinent past medical history. No past surgical history on file. No family history on file. History  Substance Use Topics  . Smoking status: Current Every Day Smoker  . Smokeless tobacco: Not on file  . Alcohol Use: No    Review of Systems  Constitutional: Negative for fever.  Musculoskeletal: Positive for arthralgias. Negative for joint swelling.  Skin: Negative for rash and wound.  Neurological: Negative for weakness and numbness.      Allergies  Review of patient's allergies indicates no known allergies.  Home Medications   Prior to Admission medications   Medication Sig Start Date End Date Taking? Authorizing Provider  naproxen sodium (ANAPROX) 220 MG tablet Take 440 mg by mouth daily as needed (for pain).   Yes Historical Provider, MD  indomethacin (INDOCIN) 25 MG capsule Take 1 capsule (25 mg total) by mouth 3 (three) times daily as needed. Patient not taking: Reported on 08/24/2014 05/24/14   [name], NP  naproxen (NAPROSYN) 250 MG tablet Take 1 tablet (250 mg total) by mouth 2 (two) times daily with a  meal. 08/24/14   Everlene Farrier, PA-C  traMADol (ULTRAM) 50 MG tablet Take 1 tablet (50 mg total) by mouth every 6 (six) hours as needed. Patient not taking: Reported on 08/24/2014 05/24/14   [name], NP   BP 121/75 mmHg  Pulse 82  Temp(Src) 98 F (36.7 C) (Oral)  Resp 17  SpO2 100% Physical Exam  Constitutional: He appears well-developed and well-nourished. No distress.  HENT:  Head: Normocephalic and atraumatic.  Eyes: Right eye exhibits no discharge. Left eye exhibits no discharge.  Cardiovascular: Normal rate, regular rhythm and intact distal pulses.   Bilateral radial pulses are intact. Patient has good capillary refill to his left distal digits.  Pulmonary/Chest: Effort normal. No respiratory distress.  Musculoskeletal: Normal range of motion. He exhibits tenderness. He exhibits no edema.  Patient has good range of motion of his left wrist. He has generalized tenderness about his left wrist without focal tenderness. He has tenderness with active range of motion of his left wrist. No left wrist edema, deformity or ecchymosis noted. No joint warmth or erythema. No left elbow tenderness to palpation.  Neurological: He is alert. Coordination normal.  Patient sensation is intact to his left hand. Patient's left radial, median and ulnar nerves are intact.  Skin: Skin is warm and dry. No rash noted. He is not diaphoretic. No erythema. No pallor.  Psychiatric: He has a normal mood and affect. His behavior is normal.  Nursing note and vitals reviewed.   ED Course  Procedures (including critical care time) Labs Review Labs Reviewed -  No data to display  Imaging Review No results found.   EKG Interpretation None      Filed Vitals:   08/24/14 1232  BP: 121/75  Pulse: 82  Temp: 98 F (36.7 C)  TempSrc: Oral  Resp: 17  SpO2: 100%     MDM   Meds given in ED:  Medications  acetaminophen (TYLENOL) tablet 650 mg (not administered)    New Prescriptions   NAPROXEN  (NAPROSYN) 250 MG TABLET    Take 1 tablet (250 mg total) by mouth 2 (two) times daily with a meal.    Final diagnoses:  Left wrist pain   This is a 39 year old right-hand dominant male who presents to the emergency department complaining of atraumatic left wrist pain ongoing for the past 2 years which is gradually worsening. On exam the patient is afebrile and nontoxic appearing. The patient has generalized left wrist tenderness to palpation. Patient has pain with active range of motion. He has full range of motion of his left wrist. He is neurovascularly intact. No wrist deformity, edema, ecchymosis or erythema noted. Patient denies any injury to his left wrist. I suspect arthritis vs carpal tunnel. Will place the patient in wrist splint and provide him with a prescription for naproxen. I advised the patient to follow-up with their primary care provider this week. I advised the patient to return to the emergency department with new or worsening symptoms or new concerns. The patient verbalized understanding and agreement with plan.      Everlene FarrierWilliam Ryin Schillo, PA-C 08/24/14 1330  Raeford RazorStephen Kohut, MD 08/29/14 [phone]

## 2014-08-24 NOTE — Discharge Instructions (Signed)
Wrist Pain Wrist injuries are frequent in adults and children. A sprain is an injury to the ligaments that hold your bones together. A strain is an injury to muscle or muscle cord-like structures (tendons) from stretching or pulling. Generally, when wrists are moderately tender to touch following a fall or injury, a break in the bone (fracture) may be present. Most wrist sprains or strains are better in 3 to 5 days, but complete healing may take several weeks. HOME CARE INSTRUCTIONS   Put ice on the injured area.  Put ice in a plastic bag.  Place a towel between your skin and the bag.  Leave the ice on for 15-20 minutes, 3-4 times a day, for the first 2 days, or as directed by your health care provider.  Keep your arm raised above the level of your heart whenever possible to reduce swelling and pain.  Rest the injured area for at least 48 hours or as directed by your health care provider.  If a splint or elastic bandage has been applied, use it for as long as directed by your health care provider or until seen by a health care provider for a follow-up exam.  Only take over-the-counter or prescription medicines for pain, discomfort, or fever as directed by your health care provider.  Keep all follow-up appointments. You may need to follow up with a specialist or have follow-up X-rays. Improvement in pain level is not a guarantee that you did not fracture a bone in your wrist. The only way to determine whether or not you have a broken bone is by X-ray. SEEK IMMEDIATE MEDICAL CARE IF:   Your fingers are swollen, very red, white, or cold and blue.  Your fingers are numb or tingling.  You have increasing pain.  You have difficulty moving your fingers. MAKE SURE YOU:   Understand these instructions.  Will watch your condition.  Will get help right away if you are not doing well or get worse. Document Released: 11/03/2004 Document Revised: 01/29/2013 Document Reviewed:  03/17/2010 [facility] Patient Information 2015 [place], [place]. This information is not intended to replace advice given to you by your health care provider. Make sure you discuss any questions you have with your health care provider. Arthritis, Nonspecific Arthritis is inflammation of a joint. This usually means pain, redness, warmth or swelling are present. One or more joints may be involved. There are a number of types of arthritis. Your caregiver may not be able to tell what type of arthritis you have right away. CAUSES  The most common cause of arthritis is the wear and tear on the joint (osteoarthritis). This causes damage to the cartilage, which can break down over time. The knees, hips, back and neck are most often affected by this type of arthritis. Other types of arthritis and common causes of joint pain include:  Sprains and other injuries near the joint. Sometimes minor sprains and injuries cause pain and swelling that develop hours later.  Rheumatoid arthritis. This affects hands, feet and knees. It usually affects both sides of your body at the same time. It is often associated with chronic ailments, fever, weight loss and general weakness.  Crystal arthritis. Gout and pseudo gout can cause occasional acute severe pain, redness and swelling in the foot, ankle, or knee.  Infectious arthritis. Bacteria can get into a joint through a break in overlying skin. This can cause infection of the joint. Bacteria and viruses can also spread through the blood and affect your joints.  Drug, infectious and allergy reactions. Sometimes joints can become mildly painful and slightly swollen with these types of illnesses. SYMPTOMS   Pain is the main symptom.  Your joint or joints can also be red, swollen and warm or hot to the touch.  You may have a fever with certain types of arthritis, or even feel overall ill.  The joint with arthritis will hurt with movement. Stiffness is present with some  types of arthritis. DIAGNOSIS  Your caregiver will suspect arthritis based on your description of your symptoms and on your exam. Testing may be needed to find the type of arthritis:  Blood and sometimes urine tests.  X-ray tests and sometimes CT or MRI scans.  Removal of fluid from the joint (arthrocentesis) is done to check for bacteria, crystals or other causes. Your caregiver (or a specialist) will numb the area over the joint with a local anesthetic, and use a needle to remove joint fluid for examination. This procedure is only minimally uncomfortable.  Even with these tests, your caregiver may not be able to tell what kind of arthritis you have. Consultation with a specialist (rheumatologist) may be helpful. TREATMENT  Your caregiver will discuss with you treatment specific to your type of arthritis. If the specific type cannot be determined, then the following general recommendations may apply. Treatment of severe joint pain includes:  Rest.  Elevation.  Anti-inflammatory medication (for example, ibuprofen) may be prescribed. Avoiding activities that cause increased pain.  Only take over-the-counter or prescription medicines for pain and discomfort as recommended by your caregiver.  Cold packs over an inflamed joint may be used for 10 to 15 minutes every hour. Hot packs sometimes feel better, but do not use overnight. Do not use hot packs if you are diabetic without your caregiver's permission.  A cortisone shot into arthritic joints may help reduce pain and swelling.  Any acute arthritis that gets worse over the next 1 to 2 days needs to be looked at to be sure there is no joint infection. Long-term arthritis treatment involves modifying activities and lifestyle to reduce joint stress jarring. This can include weight loss. Also, exercise is needed to nourish the joint cartilage and remove waste. This helps keep the muscles around the joint strong. HOME CARE INSTRUCTIONS   Do  not take aspirin to relieve pain if gout is suspected. This elevates uric acid levels.  Only take over-the-counter or prescription medicines for pain, discomfort or fever as directed by your caregiver.  Rest the joint as much as possible.  If your joint is swollen, keep it elevated.  Use crutches if the painful joint is in your leg.  Drinking plenty of fluids may help for certain types of arthritis.  Follow your caregiver's dietary instructions.  Try low-impact exercise such as:  Swimming.  Water aerobics.  Biking.  Walking.  Morning stiffness is often relieved by a warm shower.  Put your joints through regular range-of-motion. SEEK MEDICAL CARE IF:   You do not feel better in 24 hours or are getting worse.  You have side effects to medications, or are not getting better with treatment. SEEK IMMEDIATE MEDICAL CARE IF:   You have a fever.  You develop severe joint pain, swelling or redness.  Many joints are involved and become painful and swollen.  There is severe back pain and/or leg weakness.  You have loss of bowel or bladder control. Document Released: 03/03/2004 Document Revised: 04/18/2011 Document Reviewed: 03/19/2008 [facility] Patient Information 2015 [place], [place]. This  information is not intended to replace advice given to you by your health care provider. Make sure you discuss any questions you have with your health care provider. ° °

## 2014-08-31 ENCOUNTER — Encounter (HOSPITAL_COMMUNITY): Payer: Self-pay

## 2014-08-31 ENCOUNTER — Emergency Department (HOSPITAL_COMMUNITY)
Admission: EM | Admit: 2014-08-31 | Discharge: 2014-08-31 | Disposition: A | Discharge status: Home / Self Care | Payer: No Typology Code available for payment source | Attending: Emergency Medicine | Admitting: Emergency Medicine

## 2014-08-31 DIAGNOSIS — L237 Allergic contact dermatitis due to plants, except food: Secondary | ICD-10-CM | POA: Insufficient documentation

## 2014-08-31 DIAGNOSIS — Z791 Long term (current) use of non-steroidal anti-inflammatories (NSAID): Secondary | ICD-10-CM | POA: Insufficient documentation

## 2014-08-31 DIAGNOSIS — Z720 Tobacco use: Secondary | ICD-10-CM | POA: Insufficient documentation

## 2014-08-31 MED ORDER — IBUPROFEN 600 MG PO TABS: 600.0000 mg | ORAL_TABLET | Freq: Four times a day (QID) | ORAL | Status: DC | PRN

## 2014-08-31 MED ORDER — HYDROXYZINE HCL 25 MG PO TABS: 25.0000 mg | ORAL_TABLET | Freq: Four times a day (QID) | ORAL | Status: DC

## 2014-08-31 MED ORDER — HYDROCORTISONE 2.5 % EX LOTN
TOPICAL_LOTION | Freq: Two times a day (BID) | CUTANEOUS | Status: DC
Start: 2014-08-31 — End: 2015-06-30

## 2014-08-31 NOTE — ED Notes (Signed)
Pt presents with c/o rash that started Friday of this week. Pt reports the rash is painful as well and is itching. Rash to his face and hands.

## 2014-08-31 NOTE — ED Provider Notes (Signed)
CSN: [id number]     Arrival date & time 08/31/14  1811 History  This chart was scribed for non-physician practitioner, Marlon Pel, PA-C, working with Purvis Sheffield, MD, by Ronney Lion, ED Scribe. This patient was seen in room WTR5/WTR5 and the patient's care was started at 6:59 PM.   Chief Complaint  Patient presents with  . Rash   The history is provided by the patient. No language interpreter was used.    HPI Comments: Bryce Baxter is a 39 y.o. male who presents to the Emergency Department complaining of a generalized, painful, pruritic rash on his bilateral upper extremities and face that began yesterday morning. Patient states he has been working outside and suspects exposure to poison ivy. Patient states he was wearing long sleeves when this happened. He tried Benadryl with minimal relief. He denies any rash on his chest or back, anogenital region, buttocks, or legs. He denies fever, nausea, vomiting, or diarrhea.   History reviewed. No pertinent past medical history. History reviewed. No pertinent past surgical history. No family history on file. History  Substance Use Topics  . Smoking status: Current Every Day Smoker  . Smokeless tobacco: Not on file  . Alcohol Use: No    Review of Systems  Skin: Positive for rash.   A complete 10 system review of systems was obtained and all systems are negative except as noted in the HPI and PMH.    Allergies  Review of patient's allergies indicates no known allergies.  Home Medications   Prior to Admission medications   Medication Sig Start Date End Date Taking? Authorizing Provider  hydrocortisone 2.5 % lotion Apply topically 2 (two) times daily. 08/31/14   Marlon Pel, PA-C  hydrOXYzine (ATARAX/VISTARIL) 25 MG tablet Take 1 tablet (25 mg total) by mouth every 6 (six) hours. 08/31/14   [place] Dominique Neva Seat, PA-C  ibuprofen (ADVIL,MOTRIN) 600 MG tablet Take 1 tablet (600 mg total) by mouth every 6 (six) hours as needed. 08/31/14    Phinley Schall Neva Seat, PA-C  indomethacin (INDOCIN) 25 MG capsule Take 1 capsule (25 mg total) by mouth 3 (three) times daily as needed. Patient not taking: Reported on 08/24/2014 05/24/14   [name], NP  naproxen (NAPROSYN) 250 MG tablet Take 1 tablet (250 mg total) by mouth 2 (two) times daily with a meal. 08/24/14   Everlene Farrier, PA-C  naproxen sodium (ANAPROX) 220 MG tablet Take 440 mg by mouth daily as needed (for pain).    Historical Provider, MD  traMADol (ULTRAM) 50 MG tablet Take 1 tablet (50 mg total) by mouth every 6 (six) hours as needed. Patient not taking: Reported on 08/24/2014 05/24/14   [name], NP   BP 124/80 mmHg  Pulse 78  Temp(Src) 98.4 F (36.9 C) (Oral)  Resp 16  SpO2 100% Physical Exam  Constitutional: He is oriented to person, place, and time. He appears well-developed and well-nourished. No distress.  HENT:  Head: Normocephalic and atraumatic.    Eyes: Conjunctivae and EOM are normal.  Neck: Neck supple. No tracheal deviation present.  Cardiovascular: Normal rate.   Pulmonary/Chest: Effort normal. No respiratory distress.  Musculoskeletal: Normal range of motion.  Neurological: He is alert and oriented to person, place, and time.  Skin: Skin is warm and dry.  Poison ivy to bilateral distal upper extremities. Associated excoriation and induration.  Psychiatric: He has a normal mood and affect. His behavior is normal.  Nursing note and vitals reviewed.   ED Course  Procedures (including critical care time)  DIAGNOSTIC STUDIES: Oxygen Saturation is 100% on RA, normal by my interpretation.    COORDINATION OF CARE: 7:01 PM - Suspect poison ivy exposure. Will Rx topical ointment for symptomatic relief. Discussed with patient risks of self-contagion, and cautioned patient to avoid contacting the rash to his eye. Also instructed pt to avoid applying the topical ointment to his face return precautions given. Pt verbalized understanding and agreed to  plan.  MDM   Final diagnoses:  Poison ivy   hydrocortisone 2.5 % lotion Apply topically 2 (two) times daily. 59 mL Marlon Pel, PA-C    hydrOXYzine (ATARAX/VISTARIL) 25 MG tablet Take 1 tablet (25 mg total) by mouth every 6 (six) hours. 12 tablet Marlon Pel, PA-C   ibuprofen (ADVIL,MOTRIN) 600 MG tablet Take 1 tablet (600 mg total) by mouth every 6 (six) hours as needed. 20 tablet Marlon Pel, PA-C   Medications - No data to display  39 y.o.[name]'s evaluation in the Emergency Department is complete. It has been determined that no acute conditions requiring further emergency intervention are present at this time. The patient/guardian have been advised of the diagnosis and plan. We have discussed signs and symptoms that warrant return to the ED, such as changes or worsening in symptoms.  Vital signs are stable at discharge. Filed Vitals:   08/31/14 1829  BP: 124/80  Pulse: 78  Temp: 98.4 F (36.9 C)  Resp: 16    Patient/guardian has voiced understanding and agreed to follow-up with the PCP or specialist.    I personally performed the services described in this documentation, which was scribed in my presence. The recorded information has been reviewed and is accurate.     Marlon Pel, PA-C 08/31/14 1925  Purvis Sheffield, MD 09/02/14 [phone]

## 2014-08-31 NOTE — Discharge Instructions (Signed)

## 2014-09-02 ENCOUNTER — Encounter (HOSPITAL_COMMUNITY): Payer: Self-pay | Admitting: Emergency Medicine

## 2014-09-02 ENCOUNTER — Emergency Department (HOSPITAL_COMMUNITY)
Admission: EM | Admit: 2014-09-02 | Discharge: 2014-09-02 | Disposition: A | Discharge status: Home / Self Care | Payer: No Typology Code available for payment source | Attending: Emergency Medicine | Admitting: Emergency Medicine

## 2014-09-02 ENCOUNTER — Telehealth (HOSPITAL_COMMUNITY): Payer: Self-pay

## 2014-09-02 DIAGNOSIS — L237 Allergic contact dermatitis due to plants, except food: Secondary | ICD-10-CM

## 2014-09-02 DIAGNOSIS — L255 Unspecified contact dermatitis due to plants, except food: Secondary | ICD-10-CM | POA: Diagnosis not present

## 2014-09-02 DIAGNOSIS — Z720 Tobacco use: Secondary | ICD-10-CM | POA: Diagnosis not present

## 2014-09-02 DIAGNOSIS — R21 Rash and other nonspecific skin eruption: Secondary | ICD-10-CM | POA: Diagnosis present

## 2014-09-02 MED ORDER — PREDNISONE 20 MG PO TABS: 20.0000 mg | ORAL_TABLET | Freq: Every day | ORAL | Status: DC

## 2014-09-02 MED ORDER — DEXAMETHASONE SODIUM PHOSPHATE 10 MG/ML IJ SOLN
10.0000 mg | Freq: Once | INTRAMUSCULAR | Status: AC
  Administered 2014-09-02: 10 mg via INTRAMUSCULAR
  Filled 2014-09-02: qty 1

## 2014-09-02 NOTE — ED Notes (Signed)
Bryce Baxter at bedside 

## 2014-09-02 NOTE — ED Notes (Signed)
Pt added that his lower lip feels dry and swollen. Denies shortness of breath or difficulty swallowing

## 2014-09-02 NOTE — ED Provider Notes (Signed)
CSN: [id number]     Arrival date & time 09/02/14  4098 History   First MD Initiated Contact with Patient 09/02/14 1009     Chief Complaint  Patient presents with  . Poison Ivy    arm, hands , face  . Rash     (Consider location/radiation/quality/duration/timing/severity/associated sxs/prior Treatment) HPI Comments: Patient presents to the emergency department with chief complaint of poison ivy exposure. He states that he was working in the yard on Saturday and became exposed to poison ivy. He was given hydrocortisone cream and hydroxyzine on Sunday for treatment. Patient states that he has had worsening symptoms since then. Reports increased swelling of his hands. States that he has had some spreading of the rash up is forearms. Patient states that it is very painful to make a fist. 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 He denies any other symptoms. Denies fevers, chills, shortness breath or difficulty breathing, nausea, or vomiting.  The history is provided by the patient. No language interpreter was used.    History reviewed. No pertinent past medical history. Past Surgical History  Procedure Laterality Date  . Tonsillectomy    . Dental surgery     Family History  Problem Relation Age of Onset  . Hypertension Mother    History  Substance Use Topics  . Smoking status: Current Some Day Smoker    Types: Cigarettes  . Smokeless tobacco: Not on file  . Alcohol Use: No    Review of Systems  Constitutional: Negative for fever and chills.  Respiratory: Negative for shortness of breath.   Cardiovascular: Negative for chest pain.  Gastrointestinal: Negative for nausea, vomiting, diarrhea and constipation.  Genitourinary: Negative for dysuria.  Skin: Positive for rash.  All other systems reviewed and are negative.     Allergies  Review of patient's allergies indicates no known allergies.  Home Medications   Prior to Admission medications   Medication Sig  Start Date End Date Taking? Authorizing Provider  ibuprofen (ADVIL,MOTRIN) 600 MG tablet Take 1 tablet (600 mg total) by mouth every 6 (six) hours as needed. 08/31/14  Yes Tiffany Neva Seat, PA-C  hydrocortisone 2.5 % lotion Apply topically 2 (two) times daily. Patient not taking: Reported on 09/02/2014 08/31/14   Marlon Pel, PA-C  hydrOXYzine (ATARAX/VISTARIL) 25 MG tablet Take 1 tablet (25 mg total) by mouth every 6 (six) hours. 08/31/14   Tiffany Neva Seat, PA-C  indomethacin (INDOCIN) 25 MG capsule Take 1 capsule (25 mg total) by mouth 3 (three) times daily as needed. Patient not taking: Reported on 08/24/2014 05/24/14   [name], NP  naproxen (NAPROSYN) 250 MG tablet Take 1 tablet (250 mg total) by mouth 2 (two) times daily with a meal. Patient not taking: Reported on 09/02/2014 08/24/14   Everlene Farrier, PA-C  naproxen sodium (ANAPROX) 220 MG tablet Take 440 mg by mouth daily as needed (for pain).    Historical Provider, MD  traMADol (ULTRAM) 50 MG tablet Take 1 tablet (50 mg total) by mouth every 6 (six) hours as needed. Patient not taking: Reported on 08/24/2014 05/24/14   [name], NP   BP 115/78 mmHg  Pulse 71  Temp(Src) 97.8 F (36.6 C) (Oral)  Resp 20  SpO2 99% Physical Exam  Constitutional: He is oriented to person, place, and time. He appears well-developed and well-nourished.  HENT:  Head: Normocephalic and atraumatic.  Eyes: Conjunctivae and EOM are normal.  Neck: Normal range of motion.  Cardiovascular: Normal rate, regular  rhythm, normal heart sounds and intact distal pulses.  Exam reveals no gallop and no friction rub.   No murmur heard. Intact distal pulses with brisk cap refill  Pulmonary/Chest: Effort normal. No respiratory distress. He has no wheezes. He has no rales. He exhibits no tenderness.  CTAB  Abdominal: He exhibits no distension.  Musculoskeletal: Normal range of motion.  Neurological: He is alert and oriented to person, place, and time.  Skin:  Skin is dry.  As pictured below  Psychiatric: He has a normal mood and affect. His behavior is normal. Judgment and thought content normal.  Nursing note and vitals reviewed.   ED Course  Procedures (including critical care time) Labs Review Labs Reviewed - No data to display  Imaging Review No results found.           MDM   Final diagnoses:  Poison ivy dermatitis    Patient with contact dermatitis 2/2 to poison ivy.  Dermatitis mostly located on the hands with significant swelling, but no pain.  Good cap refill and distal pulses.  Highly doubt compartment syndrome.  Will recommend ice and elevation in addition to steroids.    Roxy Horseman, PA-C 09/02/14 1124  Doug Sou, MD 09/02/14 1640

## 2014-09-02 NOTE — Discharge Instructions (Signed)

## 2014-09-02 NOTE — ED Notes (Signed)
Weeping blisters and swelling noted on both hands. Pt reports an  Increase in  blistering and itching after exposure to poison ivy on both hands, arms and on his chin. Pt is unable to make a fist due to pain and swelling

## 2014-09-08 ENCOUNTER — Emergency Department (HOSPITAL_COMMUNITY)
Admission: EM | Admit: 2014-09-08 | Discharge: 2014-09-08 | Disposition: A | Discharge status: Home / Self Care | Payer: No Typology Code available for payment source | Attending: Emergency Medicine | Admitting: Emergency Medicine

## 2014-09-08 ENCOUNTER — Encounter (HOSPITAL_COMMUNITY): Payer: Self-pay | Admitting: Emergency Medicine

## 2014-09-08 DIAGNOSIS — Z7952 Long term (current) use of systemic steroids: Secondary | ICD-10-CM | POA: Diagnosis not present

## 2014-09-08 DIAGNOSIS — Z4800 Encounter for change or removal of nonsurgical wound dressing: Secondary | ICD-10-CM | POA: Insufficient documentation

## 2014-09-08 DIAGNOSIS — Z720 Tobacco use: Secondary | ICD-10-CM | POA: Insufficient documentation

## 2014-09-08 DIAGNOSIS — Z5189 Encounter for other specified aftercare: Secondary | ICD-10-CM

## 2014-09-08 DIAGNOSIS — L255 Unspecified contact dermatitis due to plants, except food: Secondary | ICD-10-CM | POA: Diagnosis not present

## 2014-09-08 NOTE — ED Notes (Addendum)
Pt is requesting a re-exam of his poison ivy to allow him to return to work. Treatment is on hand and arms

## 2014-09-08 NOTE — ED Provider Notes (Signed)
CSN: [id number]     Arrival date & time 09/08/14  1226 History  This chart was scribed for non-physician practitioner, Roxy Horseman, PA-C, working with Rolan Bucco, MD by Charline Bills, ED Scribe. This patient was seen in room WTR5/WTR5 and the patient's care was started at 12:56 PM.   Chief Complaint  Patient presents with  . Poison Ivy    follow up   The history is provided by the patient. No language interpreter was used.   HPI Comments: Crespin Forstrom is a 39 y.o. male who presents to the Emergency Department with a chief complaint of gradually improving rash to bilateral hands for the past week. Pt first noticed rash after exposure to poison ivy while doing yard work 1 week ago. Pt reports that he is still taking Prednisone. He requests a note to return to work at this time.  History reviewed. No pertinent past medical history. Past Surgical History  Procedure Laterality Date  . Tonsillectomy    . Dental surgery     Family History  Problem Relation Age of Onset  . Hypertension Mother    History  Substance Use Topics  . Smoking status: Current Some Day Smoker    Types: Cigarettes  . Smokeless tobacco: Not on file  . Alcohol Use: No    Review of Systems  Skin: Positive for rash.   Allergies  Review of patient's allergies indicates no known allergies.  Home Medications   Prior to Admission medications   Medication Sig Start Date End Date Taking? Authorizing Provider  hydrocortisone 2.5 % lotion Apply topically 2 (two) times daily. Patient not taking: Reported on 09/02/2014 08/31/14   Marlon Pel, PA-C  hydrOXYzine (ATARAX/VISTARIL) 25 MG tablet Take 1 tablet (25 mg total) by mouth every 6 (six) hours. 08/31/14   Tiffany Neva Seat, PA-C  ibuprofen (ADVIL,MOTRIN) 600 MG tablet Take 1 tablet (600 mg total) by mouth every 6 (six) hours as needed. 08/31/14   Tiffany Neva Seat, PA-C  indomethacin (INDOCIN) 25 MG capsule Take 1 capsule (25 mg total) by mouth 3 (three) times daily  as needed. Patient not taking: Reported on 08/24/2014 05/24/14   [name], NP  naproxen (NAPROSYN) 250 MG tablet Take 1 tablet (250 mg total) by mouth 2 (two) times daily with a meal. Patient not taking: Reported on 09/02/2014 08/24/14   Everlene Farrier, PA-C  naproxen sodium (ANAPROX) 220 MG tablet Take 440 mg by mouth daily as needed (for pain).    Historical Provider, MD  predniSONE (DELTASONE) 20 MG tablet Take 1 tablet (20 mg total) by mouth daily. Take 3 tablets (60 mg) for 7 days, then take 2 tablets (40 mg) for 7 days, then take 1 tablet (20 mg) for 7 days. 09/02/14   Roxy Horseman, PA-C  traMADol (ULTRAM) 50 MG tablet Take 1 tablet (50 mg total) by mouth every 6 (six) hours as needed. Patient not taking: Reported on 08/24/2014 05/24/14   [name], NP   BP 122/75 mmHg  Pulse 68  Temp(Src) 98.1 F (36.7 C) (Oral)  Resp 18  SpO2 98% Physical Exam  Constitutional: He is oriented to person, place, and time. He appears well-developed and well-nourished. No distress.  HENT:  Head: Normocephalic and atraumatic.  Eyes: Conjunctivae and EOM are normal.  Neck: Neck supple. No tracheal deviation present.  Cardiovascular: Normal rate.   Pulmonary/Chest: Effort normal. No respiratory distress.  Musculoskeletal: Normal range of motion.  Neurological: He is alert and oriented to person, place, and time.  Skin:  Skin is warm and dry. Rash noted.  Poison ivy on dorsal aspect of bilateral hands appears significantly improved from prior visit.   Psychiatric: He has a normal mood and affect. His behavior is normal.  Nursing note and vitals reviewed.  ED Course  Procedures (including critical care time) DIAGNOSTIC STUDIES: Oxygen Saturation is 98% on RA, normal by my interpretation.    COORDINATION OF CARE: 1:00 PM-Discussed treatment plan which includes continue Prednisone with pt at bedside and pt agreed to plan.   Labs Review Labs Reviewed - No data to display  Imaging  Review No results found.   EKG Interpretation None      MDM   Final diagnoses:  Encounter for wound re-check    Patient with improving poison ivy rash. Will discharge to home with her work.  I personally performed the services described in this documentation, which was scribed in my presence. The recorded information has been reviewed and is accurate.     Roxy Horseman, PA-C 09/08/14 1318  Rolan Bucco, MD 09/08/14 [id number]

## 2014-09-08 NOTE — Discharge Instructions (Signed)

## 2015-03-11 ENCOUNTER — Emergency Department (INDEPENDENT_AMBULATORY_CARE_PROVIDER_SITE_OTHER)
Admission: EM | Admit: 2015-03-11 | Discharge: 2015-03-11 | Disposition: A | Discharge status: Home / Self Care | Payer: Self-pay | Source: Home / Self Care | Attending: Family Medicine | Admitting: Family Medicine

## 2015-03-11 ENCOUNTER — Encounter (HOSPITAL_COMMUNITY): Payer: Self-pay | Admitting: *Deleted

## 2015-03-11 DIAGNOSIS — B001 Herpesviral vesicular dermatitis: Secondary | ICD-10-CM

## 2015-03-11 MED ORDER — VALACYCLOVIR HCL 1 G PO TABS: 2000.0000 mg | ORAL_TABLET | Freq: Two times a day (BID) | ORAL | Status: DC

## 2015-03-11 NOTE — ED Provider Notes (Signed)
CSN: [id number]     Arrival date & time 03/11/15  1478 History   First MD Initiated Contact with Patient 03/11/15 2017     Chief Complaint  Patient presents with  . Mouth Lesions   (Consider location/radiation/quality/duration/timing/severity/associated sxs/prior Treatment) Patient is a 40 y.o. male presenting with mouth sores. The history is provided by the patient.  Mouth Lesions Location:  Lower lip Quality:  Blistered, crusty and painful Pain details:    Severity:  Mild   Duration:  1 day   Progression:  Worsening Onset quality:  Sudden Severity:  Moderate Chronicity:  Chronic Context: stress   Associated symptoms: no fever     History reviewed. No pertinent past medical history. Past Surgical History  Procedure Laterality Date  . Tonsillectomy    . Dental surgery     Family History  Problem Relation Age of Onset  . Hypertension Mother    Social History  Substance Use Topics  . Smoking status: Current Some Day Smoker    Types: Cigarettes  . Smokeless tobacco: None  . Alcohol Use: No    Review of Systems  Constitutional: Negative.  Negative for fever.  HENT: Positive for mouth sores.   Cardiovascular: Negative.   Gastrointestinal: Negative.   Musculoskeletal: Negative.   All other systems reviewed and are negative.   Allergies  Review of patient's allergies indicates no known allergies.  Home Medications   Prior to Admission medications   Medication Sig Start Date End Date Taking? Authorizing Provider  hydrocortisone 2.5 % lotion Apply topically 2 (two) times daily. Patient not taking: Reported on 09/02/2014 08/31/14   Marlon Pel, PA-C  hydrOXYzine (ATARAX/VISTARIL) 25 MG tablet Take 1 tablet (25 mg total) by mouth every 6 (six) hours. 08/31/14   Tiffany Neva Seat, PA-C  ibuprofen (ADVIL,MOTRIN) 600 MG tablet Take 1 tablet (600 mg total) by mouth every 6 (six) hours as needed. 08/31/14   Tiffany Neva Seat, PA-C  indomethacin (INDOCIN) 25 MG capsule Take 1  capsule (25 mg total) by mouth 3 (three) times daily as needed. Patient not taking: Reported on 08/24/2014 05/24/14   [name], NP  naproxen (NAPROSYN) 250 MG tablet Take 1 tablet (250 mg total) by mouth 2 (two) times daily with a meal. Patient not taking: Reported on 09/02/2014 08/24/14   Everlene Farrier, PA-C  naproxen sodium (ANAPROX) 220 MG tablet Take 440 mg by mouth daily as needed (for pain).    Historical Provider, MD  predniSONE (DELTASONE) 20 MG tablet Take 1 tablet (20 mg total) by mouth daily. Take 3 tablets (60 mg) for 7 days, then take 2 tablets (40 mg) for 7 days, then take 1 tablet (20 mg) for 7 days. 09/02/14   Roxy Horseman, PA-C  traMADol (ULTRAM) 50 MG tablet Take 1 tablet (50 mg total) by mouth every 6 (six) hours as needed. Patient not taking: Reported on 08/24/2014 05/24/14   [name], NP  valACYclovir (VALTREX) 1000 MG tablet Take 2 tablets (2,000 mg total) by mouth 2 (two) times daily. 03/11/15   Linna Hoff, MD   Meds Ordered and Administered this Visit  Medications - No data to display  BP 138/72 mmHg  Pulse 62  Temp(Src) 98.2 F (36.8 C) (Oral)  Resp 18  SpO2 100% No data found.   Physical Exam  Constitutional: He is oriented to person, place, and time. He appears well-developed and well-nourished.  HENT:  Mouth/Throat: Uvula is midline.    Eyes: Pupils are equal, round, and reactive to light.  Neck: Normal range of motion. Neck supple.  Neurological: He is alert and oriented to person, place, and time.  Skin: Skin is warm and dry.  Nursing note and vitals reviewed.   ED Course  Procedures (including critical care time)  Labs Review Labs Reviewed - No data to display  Imaging Review No results found.   Visual Acuity Review  Right Eye Distance:   Left Eye Distance:   Bilateral Distance:    Right Eye Near:   Left Eye Near:    Bilateral Near:         MDM   1. Herpes simplex labialis    Meds ordered this encounter    Medications  . valACYclovir (VALTREX) 1000 MG tablet    Sig: Take 2 tablets (2,000 mg total) by mouth 2 (two) times daily.    Dispense:  4 tablet    Refill:  3        Linna Hoff, MD 03/11/15 2026

## 2015-03-11 NOTE — ED Notes (Signed)
Pt has  Blister  Sores  On lower  Lip    Worse  Today        He  Reports      He  Has  Had  Blisters  In    Past       But  This  Is   Worse      Than before     he  Reports   He  Has  Tried  Home  Remedies      Without     releif

## 2015-06-30 ENCOUNTER — Ambulatory Visit (INDEPENDENT_AMBULATORY_CARE_PROVIDER_SITE_OTHER): Payer: Self-pay

## 2015-06-30 ENCOUNTER — Encounter (HOSPITAL_COMMUNITY): Payer: Self-pay | Admitting: *Deleted

## 2015-06-30 ENCOUNTER — Ambulatory Visit (HOSPITAL_COMMUNITY)
Admission: EM | Admit: 2015-06-30 | Discharge: 2015-06-30 | Disposition: A | Discharge status: Home / Self Care | Payer: Self-pay | Attending: Emergency Medicine | Admitting: Emergency Medicine

## 2015-06-30 DIAGNOSIS — M25532 Pain in left wrist: Secondary | ICD-10-CM

## 2015-06-30 NOTE — Discharge Instructions (Signed)
Your x-ray is normal. There is no sign of arthritis. I suspect there is some movement you do at work that pinches a nerve. This then causes the pain and loss of strength for a short period of time. A little bit of ice will go long way. You can also take Tylenol or ibuprofen to help with the pain. Follow-up as needed.

## 2015-06-30 NOTE — ED Provider Notes (Signed)
CSN: [id number]     Arrival date & time 06/30/15  1912 History   First MD Initiated Contact with Patient 06/30/15 2001     Chief Complaint  Patient presents with  . Wrist Pain   (Consider location/radiation/quality/duration/timing/severity/associated sxs/prior Treatment) HPI He is a 40 year old man here for evaluation of left wrist pain. He states this started yesterday. It was associated with loss of grip strength. He took some Tylenol yesterday. Today, he continues to have some discomfort and some weakness, but much improved from yesterday. He denies any specific injury or trauma. No numbness or tingling. No prior injury.  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  History reviewed. No pertinent past medical history. Past Surgical History  Procedure Laterality Date  . Tonsillectomy    . Dental surgery     Family History  Problem Relation Age of Onset  . Hypertension Mother    Social History  Substance Use Topics  . Smoking status: Current Some Day Smoker    Types: Cigarettes  . Smokeless tobacco: None  . Alcohol Use: No    Review of Systems As in history of present illness Allergies  Review of patient's allergies indicates no known allergies.  Home Medications   Prior to Admission medications   Medication Sig Start Date End Date Taking? Authorizing Provider  hydrOXYzine (ATARAX/VISTARIL) 25 MG tablet Take 1 tablet (25 mg total) by mouth every 6 (six) hours. 08/31/14   Tiffany Neva Seat, PA-C  ibuprofen (ADVIL,MOTRIN) 600 MG tablet Take 1 tablet (600 mg total) by mouth every 6 (six) hours as needed. 08/31/14   Tiffany Neva Seat, PA-C  naproxen sodium (ANAPROX) 220 MG tablet Take 440 mg by mouth daily as needed (for pain).    Historical Provider, MD  valACYclovir (VALTREX) 1000 MG tablet Take 2 tablets (2,000 mg total) by mouth 2 (two) times daily. 03/11/15   Linna Hoff, MD   Meds Ordered and Administered this Visit  Medications - No data to display  BP  124/75 mmHg  Pulse 60  Temp(Src) 98.1 F (36.7 C) (Oral)  Resp 16  SpO2 99% No data found.   Physical Exam  Constitutional: He is oriented to person, place, and time. He appears well-developed and well-nourished. No distress.  Cardiovascular: Normal rate.   Musculoskeletal:  Left wrist: No erythema or edema. He is slightly tender over the distal radius. He has full active range of motion of the wrist with 5 out of 5 strength. 2+ radial pulse. Left hand: 5 out of 5 strength and interosseous and grip. 5 minus out of 5 strength in thumb opposition.  Neurological: He is alert and oriented to person, place, and time.    ED Course  Procedures (including critical care time)  Labs Review Labs Reviewed - No data to display  Imaging Review Dg Wrist Complete Left  06/30/2015  CLINICAL DATA:  Left wrist.  No injury. EXAM: LEFT WRIST - COMPLETE 3+ VIEW COMPARISON:  None. FINDINGS: There is no evidence of fracture or dislocation. There is no evidence of arthropathy or other focal bone abnormality. Soft tissues are unremarkable. IMPRESSION: Negative. Electronically Signed   By: Elberta Fortis M.D.   On: 06/30/2015 20:22     MDM   1. Left wrist pain    X-rays normal. I suspect he did something at work that caused the pain and temporary weakness. As this is mostly resolved at this time, will continue conservative management with rest, ice, and Tylenol/ibuprofen. Work  note provided. Follow-up as needed.    Charm RingsErin J Alieu Finnigan, MD 06/30/15 2030

## 2015-06-30 NOTE — ED Notes (Signed)
Pt  Reports   Symptoms  Of   weakness  In  The  Grip    Of his  l hand    And    Some  weakness     In the  Affected  Hand      denys  Any  specefic  Injury

## 2015-08-24 ENCOUNTER — Encounter (HOSPITAL_COMMUNITY): Payer: Self-pay | Admitting: Emergency Medicine

## 2015-08-24 ENCOUNTER — Ambulatory Visit (HOSPITAL_COMMUNITY)
Admission: EM | Admit: 2015-08-24 | Discharge: 2015-08-24 | Disposition: A | Discharge status: Home / Self Care | Payer: Self-pay | Attending: Family Medicine | Admitting: Family Medicine

## 2015-08-24 DIAGNOSIS — M7711 Lateral epicondylitis, right elbow: Secondary | ICD-10-CM

## 2015-08-24 DIAGNOSIS — H6091 Unspecified otitis externa, right ear: Secondary | ICD-10-CM

## 2015-08-24 MED ORDER — NEOMYCIN-POLYMYXIN-HC 3.5-10000-1 OT SUSP: 4.0000 [drp] | Freq: Three times a day (TID) | OTIC | Status: DC

## 2015-08-24 MED ORDER — MELOXICAM 7.5 MG PO TABS: 7.5000 mg | ORAL_TABLET | Freq: Two times a day (BID) | ORAL | Status: DC

## 2015-08-24 NOTE — ED Notes (Signed)
Right ear pain since yesterday.  Denies cough, cold, runny nose.   Right elbow pain.  Pain for one week, no known injury.  Tenderness when touching right elbow

## 2015-08-24 NOTE — ED Provider Notes (Signed)
CSN: [id number]     Arrival date & time 08/24/15  1222 History   First MD Initiated Contact with Patient 08/24/15 1342     Chief Complaint  Patient presents with  . Otalgia  . Elbow Pain   (Consider location/radiation/quality/duration/timing/severity/associated sxs/prior Treatment) Patient is a 40 y.o. male presenting with ear pain and arm injury. The history is provided by the patient.  Otalgia Location:  Right Quality:  Burning Severity:  No pain Progression:  Unchanged Chronicity:  New Associated symptoms: no congestion, no cough, no ear discharge, no fever, no hearing loss and no rash   Risk factors: no recent travel and no chronic ear infection   Arm Injury Location:  Elbow Time since incident:  1 week Injury: no   Elbow location:  R elbow Pain details:    Quality:  Sharp   Radiates to:  Does not radiate   Severity:  No pain   Onset quality:  Gradual   Progression:  Unchanged Chronicity:  New Handedness:  Right-handed Dislocation: no   Relieved by:  None tried Worsened by:  Nothing tried Ineffective treatments:  None tried Associated symptoms: decreased range of motion, muscle weakness and stiffness   Associated symptoms: no fever     History reviewed. No pertinent past medical history. Past Surgical History  Procedure Laterality Date  . Tonsillectomy    . Dental surgery     Family History  Problem Relation Age of Onset  . Hypertension Mother    Social History  Substance Use Topics  . Smoking status: Current Some Day Smoker    Types: Cigarettes  . Smokeless tobacco: None  . Alcohol Use: No    Review of Systems  Constitutional: Negative.  Negative for fever.  HENT: Positive for ear pain. Negative for congestion, ear discharge, facial swelling, hearing loss and mouth sores.   Eyes: Negative.   Respiratory: Negative for cough.   Musculoskeletal: Positive for stiffness.  Skin: Negative for rash.  All other systems reviewed and are  negative.   Allergies  Review of patient's allergies indicates no known allergies.  Home Medications   Prior to Admission medications   Medication Sig Start Date End Date Taking? Authorizing Provider  hydrOXYzine (ATARAX/VISTARIL) 25 MG tablet Take 1 tablet (25 mg total) by mouth every 6 (six) hours. Patient not taking: Reported on 08/24/2015 08/31/14   Marlon Pel, PA-C  ibuprofen (ADVIL,MOTRIN) 600 MG tablet Take 1 tablet (600 mg total) by mouth every 6 (six) hours as needed. 08/31/14   Tiffany Neva Seat, PA-C  meloxicam (MOBIC) 7.5 MG tablet Take 1 tablet (7.5 mg total) by mouth 2 (two) times daily after a meal. 08/24/15   Linna Hoff, MD  naproxen sodium (ANAPROX) 220 MG tablet Take 440 mg by mouth daily as needed (for pain).    Historical Provider, MD  neomycin-polymyxin-hydrocortisone (CORTISPORIN) 3.5-10000-1 otic suspension Place 4 drops into the right ear 3 (three) times daily. 08/24/15   Linna Hoff, MD  valACYclovir (VALTREX) 1000 MG tablet Take 2 tablets (2,000 mg total) by mouth 2 (two) times daily. Patient not taking: Reported on 08/24/2015 03/11/15   Linna Hoff, MD   Meds Ordered and Administered this Visit  Medications - No data to display  BP 102/72 mmHg  Pulse 60  Temp(Src) 98.3 F (36.8 C) (Oral)  Resp 12  SpO2 100% No data found.   Physical Exam  Constitutional: He is oriented to person, place, and time. He appears well-developed and well-nourished.  HENT:  Right Ear: External ear normal.  Left Ear: External ear normal.  Mouth/Throat: Oropharynx is clear and moist.  Eyes: Conjunctivae are normal. Pupils are equal, round, and reactive to light.  Neck: Normal range of motion. Neck supple.  Musculoskeletal: He exhibits tenderness.       Right elbow: He exhibits decreased range of motion and swelling. Tenderness found. Lateral epicondyle tenderness noted.  Lymphadenopathy:    He has no cervical adenopathy.  Neurological: He is alert and oriented to person,  place, and time.  Skin: Skin is warm and dry.  Vitals reviewed.   ED Course  Procedures (including critical care time)  Labs Review Labs Reviewed - No data to display  Imaging Review No results found.   Visual Acuity Review  Right Eye Distance:   Left Eye Distance:   Bilateral Distance:    Right Eye Near:   Left Eye Near:    Bilateral Near:         MDM   1. Otitis externa, right   2. Lateral epicondylitis, right        Linna HoffJames D Kindl, MD 08/24/15 2039

## 2015-10-01 ENCOUNTER — Encounter (HOSPITAL_COMMUNITY): Payer: Self-pay | Admitting: Emergency Medicine

## 2015-10-01 ENCOUNTER — Ambulatory Visit (HOSPITAL_COMMUNITY)
Admission: EM | Admit: 2015-10-01 | Discharge: 2015-10-01 | Disposition: A | Discharge status: Home / Self Care | Payer: BLUE CROSS/BLUE SHIELD | Attending: Emergency Medicine | Admitting: Emergency Medicine

## 2015-10-01 DIAGNOSIS — M7711 Lateral epicondylitis, right elbow: Secondary | ICD-10-CM | POA: Diagnosis not present

## 2015-10-01 NOTE — ED Provider Notes (Signed)
CSN: [id number]     Arrival date & time 10/01/15  1447 History   First MD Initiated Contact with Patient 10/01/15 1543     Chief Complaint  Patient presents with  . Wrist Pain   (Consider location/radiation/quality/duration/timing/severity/associated sxs/prior Treatment) HPI  Bryce Baxter is a 40 y.o. male presenting to UC with c/o worsening Right elbow pain along lateral aspect that started 1 week ago.  Pt was seen at this facility for similar pain on 08/24/15.  He was advised he had a pinched nerve and was prescribed Meloxicam and Prednisone.  Symptoms did gradually improve, but then came back.  Pain is aching and sore, radiating into his wrist, 8/10, worse with movement. 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     No past medical history on file. Past Surgical History:  Procedure Laterality Date  . DENTAL SURGERY    . TONSILLECTOMY     Family History  Problem Relation Age of Onset  . Hypertension Mother    Social History  Substance Use Topics  . Smoking status: Current Some Day Smoker    Packs/day: 1.00    Years: 10.00    Types: Cigarettes  . Smokeless tobacco: Never Used  . Alcohol use No    Review of Systems  Musculoskeletal: Positive for arthralgias and myalgias. Negative for joint swelling.       Right elbow  Skin: Negative for color change, rash and wound.  Neurological: Positive for weakness and numbness.    Allergies  Review of patient's allergies indicates no known allergies.  Home Medications   Prior to Admission medications   Medication Sig Start Date End Date Taking? Authorizing Provider  hydrOXYzine (ATARAX/VISTARIL) 25 MG tablet Take 1 tablet (25 mg total) by mouth every 6 (six) hours. Patient not taking: Reported on 08/24/2015 08/31/14   Marlon Peliffany Greene, PA-C  ibuprofen (ADVIL,MOTRIN) 600 MG tablet Take 1 tablet (600 mg total) by mouth every 6 (six) hours as needed. 08/31/14   Tiffany Neva SeatGreene, PA-C  meloxicam (MOBIC) 7.5 MG tablet Take 1 tablet  (7.5 mg total) by mouth 2 (two) times daily after a meal. 08/24/15   Linna HoffJames D Kindl, MD  naproxen sodium (ANAPROX) 220 MG tablet Take 440 mg by mouth daily as needed (for pain).    Historical Provider, MD  neomycin-polymyxin-hydrocortisone (CORTISPORIN) 3.5-10000-1 otic suspension Place 4 drops into the right ear 3 (three) times daily. 08/24/15   Linna HoffJames D Kindl, MD  valACYclovir (VALTREX) 1000 MG tablet Take 2 tablets (2,000 mg total) by mouth 2 (two) times daily. Patient not taking: Reported on 08/24/2015 03/11/15   Linna HoffJames D Kindl, MD   Meds Ordered and Administered this Visit  Medications - No data to display  BP 119/76 (BP Location: Left Arm)   Pulse 60   Temp 98 F (36.7 C) (Oral)   Resp 18   SpO2 99%  No data found.   Physical Exam  Constitutional: He is oriented to person, place, and time. He appears well-developed and well-nourished.  HENT:  Head: Normocephalic and atraumatic.  Eyes: EOM are normal.  Neck: Normal range of motion. Neck supple.  Cardiovascular: Normal rate.   Pulses:      Radial pulses are 2+ on the right side.  Pulmonary/Chest: Effort normal.  Musculoskeletal: Normal range of motion. He exhibits tenderness. He exhibits no edema or deformity.  Right elbow: no edema. Tenderness to lateral epicondyle. Full ROM. 5/5 grip strength bilaterally.   Neurological: He is alert and oriented to person,  place, and time.  Skin: Skin is warm and dry. Capillary refill takes less than 2 seconds. No erythema.  Right elbow: skin in tact, no ecchymosis or erythema.   Psychiatric: He has a normal mood and affect. His behavior is normal.  Nursing note and vitals reviewed.   Urgent Care Course   Clinical Course    Procedures (including critical care time)  Labs Review Labs Reviewed - No data to display  Imaging Review No results found.    MDM   1. Lateral epicondylitis, right    Pt c/o persistent Right elbow pain. Exam c/w lateral epicondylitis. Encouraged to start  using OTC arm strap for Tennis elbow.   May continue to take meloxicam as needed. F/u with Sports Medicine or Orthopedist if not improving. Patient verbalized understanding and agreement with treatment plan.     Junius Finner, PA-C 10/01/15 1715

## 2015-10-01 NOTE — ED Triage Notes (Signed)
[facility] a [facility] of right wrist pain and weakness that has been recurrent for a month. The patient stated that he was evaluated on 08/24/2015 for the same ad advised it could be a pinched nerve. The patient stated that he was prescribed an oral steroid and it helped. He stated that it started hurting again 1 week ago and his job stated that he needed to be re-evaluated.

## 2015-10-01 NOTE — Discharge Instructions (Signed)
You still have 1 [facility] off [address]   Meloxicam (Mobic) is an antiinflammatory to help with pain and inflammation.  Do not take ibuprofen, Advil, Aleve, or any other medications that contain NSAIDs while taking meloxicam as this may cause stomach upset or even ulcers if taken in large amounts for an extended period of time.   Please refer to image of arm strap.  You will likely benefit from wearing a tennis elbow arm strap. There are various brands and types out there. Please ask your pharmacist for assistance finding the best one for you.

## 2016-04-14 ENCOUNTER — Ambulatory Visit (HOSPITAL_COMMUNITY)
Admission: EM | Admit: 2016-04-14 | Discharge: 2016-04-14 | Disposition: A | Discharge status: Home / Self Care | Payer: BLUE CROSS/BLUE SHIELD | Attending: Internal Medicine | Admitting: Internal Medicine

## 2016-04-14 ENCOUNTER — Encounter (HOSPITAL_COMMUNITY): Payer: Self-pay | Admitting: *Deleted

## 2016-04-14 DIAGNOSIS — K047 Periapical abscess without sinus: Secondary | ICD-10-CM

## 2016-04-14 MED ORDER — IBUPROFEN 800 MG PO TABS: 800.0000 mg | ORAL_TABLET | Freq: Three times a day (TID) | ORAL | 0 refills | Status: DC

## 2016-04-14 MED ORDER — HYDROCODONE-ACETAMINOPHEN 5-325 MG PO TABS: 1.0000 | ORAL_TABLET | Freq: Four times a day (QID) | ORAL | 0 refills | Status: AC | PRN

## 2016-04-14 MED ORDER — CLINDAMYCIN HCL 150 MG PO CAPS: 150.0000 mg | ORAL_CAPSULE | Freq: Four times a day (QID) | ORAL | 0 refills | Status: DC

## 2016-04-14 NOTE — ED Triage Notes (Addendum)
Pt  Reports    l    Lower     Toothache      With   Swelling  Present        History     Of  Dental  Problems  In  Past      Pt  States  Has  An  appt  With a dentist     In  The  Near  Future

## 2016-04-14 NOTE — ED Provider Notes (Signed)
CSN: [id number]     Arrival date & time 04/14/16  1326 History   None    Chief Complaint  Patient presents with  . Facial Swelling   (Consider location/radiation/quality/duration/timing/severity/associated sxs/prior Treatment) 41 year old male presents to clinic with chief complaint of dental pain for 3-4 days. He states he has an appointment coming up with the dentist however he will not be able to see them for approximately 1 month. These complaining of facial swelling, and pain. He denies any fever, nausea, vomiting, chest pain, shortness of breath and wheezing, abdominal pain any rashes, blurred vision headaches, difficulty hearing, ear pain or pressure, dizziness, or other complaints.   The history is provided by the patient.    History reviewed. No pertinent past medical history. Past Surgical History:  Procedure Laterality Date  . DENTAL SURGERY    . TONSILLECTOMY     Family History  Problem Relation Age of Onset  . Hypertension Mother    Social History  Substance Use Topics  . Smoking status: Current Some Day Smoker    Packs/day: 1.00    Years: 10.00    Types: Cigarettes  . Smokeless tobacco: Never Used  . Alcohol use No    Review of Systems  Reason unable to perform ROS: As covered in history of present illness.  All other systems reviewed and are negative.   Allergies  Patient has no known allergies.  Home Medications   Prior to Admission medications   Medication Sig Start Date End Date Taking? Authorizing Provider  clindamycin (CLEOCIN) 150 MG capsule Take 1 capsule (150 mg total) by mouth every 6 (six) hours. 04/14/16   Dorena BodoLawrence Mycala Warshawsky, NP  HYDROcodone-acetaminophen (NORCO/VICODIN) 5-325 MG tablet Take 1-2 tablets by mouth every 6 (six) hours as needed. 04/14/16 04/19/16  Dorena BodoLawrence Kristina Bertone, NP  hydrOXYzine (ATARAX/VISTARIL) 25 MG tablet Take 1 tablet (25 mg total) by mouth every 6 (six) hours. Patient not taking: Reported on 08/24/2015 08/31/14   Marlon Peliffany Greene,  PA-C  ibuprofen (ADVIL,MOTRIN) 800 MG tablet Take 1 tablet (800 mg total) by mouth 3 (three) times daily. 04/14/16   Dorena BodoLawrence Giannis Corpuz, NP  naproxen sodium (ANAPROX) 220 MG tablet Take 440 mg by mouth daily as needed (for pain).    Historical Provider, MD  neomycin-polymyxin-hydrocortisone (CORTISPORIN) 3.5-10000-1 otic suspension Place 4 drops into the right ear 3 (three) times daily. 08/24/15   Linna HoffJames D Kindl, MD  valACYclovir (VALTREX) 1000 MG tablet Take 2 tablets (2,000 mg total) by mouth 2 (two) times daily. Patient not taking: Reported on 08/24/2015 03/11/15   Linna HoffJames D Kindl, MD   Meds Ordered and Administered this Visit  Medications - No data to display  BP 132/78 (BP Location: Right Arm)   Pulse 78   Temp 99 F (37.2 C) (Oral)   Resp 18   SpO2 98%  No data found.   Physical Exam  Constitutional: He is oriented to person, place, and time. He appears well-developed and well-nourished. No distress.  HENT:  Head: Normocephalic and atraumatic.  Right Ear: External ear normal.  Left Ear: External ear normal.  Mouth/Throat: Uvula is midline, oropharynx is clear and moist and mucous membranes are normal. Tonsils are 1+ on the right. Tonsils are 1+ on the left. No tonsillar exudate.  Patient has no upper teeth, obvious dental Cary that the 18th tooth, 17th tooth appears to have been pulled,. Appears as if there are dental caries and teeth 20-24.  Neck: Normal range of motion.  Cardiovascular: Normal rate and regular rhythm.  Pulmonary/Chest: Effort normal and breath sounds normal.  Lymphadenopathy:    He has no cervical adenopathy.  Neurological: He is alert and oriented to person, place, and time.  Skin: Skin is warm and dry. Capillary refill takes less than 2 seconds. He is not diaphoretic.  Psychiatric: He has a normal mood and affect. His behavior is normal.  Nursing note and vitals reviewed.   Urgent Care Course     Procedures (including critical care time)  Labs Review Labs  Reviewed - No data to display  Imaging Review No results found.    MDM   1. Dental abscess    You have a dental abscess. I prescribed an antibiotic take one tablet 4 times a day. I have also prescribed a medicine for pain called hydrocodone, this medicine is a narcotic, it will cause drowsiness, and it is addictive. Do not take more than what is necessary, do not drink alcohol while taking, and do not operate any heavy machinery while taking this medicine. Use this medicine as a "rescue medicine" only when in severe pain.  Take ibuprofen 800 mg every 8 hours around the clock for your main pain control. You will need to follow up with the dentist for definitive care as your condition will return without proper dental treatment.      Dorena Bodo, NP 04/14/16 1434

## 2016-04-14 NOTE — Discharge Instructions (Signed)
You have a dental abscess. I prescribed an antibiotic take one tablet 4 times a day. I have also prescribed a medicine for pain called hydrocodone, this medicine is a narcotic, it will cause drowsiness, and it is addictive. Do not take more than what is necessary, do not drink alcohol while taking, and do not operate any heavy machinery while taking this medicine. Use this medicine as a "rescue medicine" only when in severe pain.  Take ibuprofen 800 mg every 8 hours around the clock for your main pain control. You will need to follow up with the dentist for definitive care as your condition will return without proper dental treatment.

## 2017-08-09 ENCOUNTER — Encounter (HOSPITAL_COMMUNITY): Payer: Self-pay | Admitting: Emergency Medicine

## 2017-08-09 ENCOUNTER — Ambulatory Visit (HOSPITAL_COMMUNITY)
Admission: EM | Admit: 2017-08-09 | Discharge: 2017-08-09 | Disposition: A | Discharge status: Home / Self Care | Payer: BLUE CROSS/BLUE SHIELD | Attending: Internal Medicine | Admitting: Internal Medicine

## 2017-08-09 DIAGNOSIS — M25532 Pain in left wrist: Secondary | ICD-10-CM

## 2017-08-09 MED ORDER — MELOXICAM 7.5 MG PO TABS: 7.5000 mg | ORAL_TABLET | Freq: Every day | ORAL | 0 refills | Status: DC

## 2017-08-09 NOTE — ED Triage Notes (Signed)
Pt sts left wrist pain x 1 week

## 2017-08-09 NOTE — Discharge Instructions (Signed)
Start Mobic. Do not take ibuprofen (motrin/advil)/ naproxen (aleve) while on mobic. Ice compress, elevation, wrist splint during activity. Symptoms could be due to inflammation, swelling, pinching of a nerve. Follow up with PCP/orthopedics for further evaluation if symptoms not improving.

## 2017-08-09 NOTE — ED Provider Notes (Signed)
[facility]    CSN: [id number] Arrival date & time: 08/09/17  1554     History   Chief Complaint Chief Complaint  Patient presents with  . Wrist Pain    HPI [place] SilosReginald Cannady is a 42 y.o. male.   42 year old male comes in with intermittent left wrist pain for the past week.  States it has been gradually building up, and now pain has been more constant, focus on the ulnar side of the left wrist.  Work requires repetitive motion, lifting.  States has had trouble working due to the pain.  Has also had numbness, tingling to the fingers during certain movements.  He denies any injury/trauma.  Denies erythema, increased warmth, swelling.  Denies fever, chills, night sweats.  He has been using a wrist wrap.  Has not taken anything for the symptoms.     History reviewed. No pertinent past medical history.  There are no active problems to display for this patient.   Past Surgical History:  Procedure Laterality Date  . DENTAL SURGERY    . TONSILLECTOMY         Home Medications    Prior to Admission medications   Medication Sig Start Date End Date Taking? Authorizing Provider  hydrOXYzine (ATARAX/VISTARIL) 25 MG tablet Take 1 tablet (25 mg total) by mouth every 6 (six) hours. Patient not taking: Reported on 08/24/2015 08/31/14   Marlon PelGreene, Tiffany, PA-C  ibuprofen (ADVIL,MOTRIN) 800 MG tablet Take 1 tablet (800 mg total) by mouth 3 (three) times daily. 04/14/16   Dorena BodoKennard, Lawrence, NP  meloxicam (MOBIC) 7.5 MG tablet Take 1 tablet (7.5 mg total) by mouth daily. 08/09/17   Cathie HoopsYu, Amy V, PA-C  naproxen sodium (ANAPROX) 220 MG tablet Take 440 mg by mouth daily as needed (for pain).    [provider]  neomycin-polymyxin-hydrocortisone (CORTISPORIN) 3.5-10000-1 otic suspension Place 4 drops into the right ear 3 (three) times daily. Patient not taking: Reported on 08/09/2017 08/24/15   Linna HoffKindl, James D, MD  valACYclovir (VALTREX) 1000 MG tablet Take 2 tablets (2,000 mg total) by mouth  2 (two) times daily. Patient not taking: Reported on 08/24/2015 03/11/15   Linna HoffKindl, James D, MD    Family History Family History  Problem Relation Age of Onset  . Hypertension Mother     Social History Social History   Tobacco Use  . Smoking status: Current Some Day Smoker    Packs/day: 1.00    Years: 10.00    Pack years: 10.00    Types: Cigarettes  . Smokeless tobacco: Never Used  Substance Use Topics  . Alcohol use: No  . Drug use: Yes    Types: Marijuana     Allergies   Patient has no known allergies.   Review of Systems Review of Systems  Reason unable to perform ROS: See HPI as above.     Physical Exam Triage Vital Signs ED Triage Vitals [08/09/17 1619]  Enc Vitals Group     BP (!) 129/92     Pulse Rate 90     Resp 18     Temp 98.8 F (37.1 C)     Temp Source Temporal     SpO2 100 %     Weight      Height      Head Circumference      Peak Flow      Pain Score      Pain Loc      Pain Edu?  Excl. in GC?    No data found.  Updated Vital Signs BP (!) 129/92 (BP Location: Left Arm)   Pulse 90   Temp 98.8 F (37.1 C) (Temporal)   Resp 18   SpO2 100%   Physical Exam  Constitutional: He is oriented to person, place, and time. He appears well-developed and well-nourished. No distress.  HENT:  Head: Normocephalic and atraumatic.  Eyes: Pupils are equal, round, and reactive to light. Conjunctivae are normal.  Musculoskeletal:  No swelling, erythema, increased warmth, contusion seen.  Tenderness to palpation of ulnar aspect of the left wrist.  Full range of motion of the wrist and fingers.  Wrist strength normal and ankle bilaterally.  Grip strength 4 out of 5 of the left hand.  Sensation slightly decreased of the left fifth finger, rest intact and equal bilaterally.  Radial pulse 2+ and equal bilaterally.  Cap refill less than 2 seconds.  Negative Tinel's, Phalen's, Finkelstein's.  Neurological: He is alert and oriented to person, place, and time.    Skin: He is not diaphoretic.     UC Treatments / Results  Labs (all labs ordered are listed, but only abnormal results are displayed) Labs Reviewed - No data to display  EKG None  Radiology No results found.  Procedures Procedures (including critical care time)  Medications Ordered in UC Medications - No data to display  Initial Impression / Assessment and Plan / UC Course  I have reviewed the triage vital signs and the nursing notes.  Pertinent labs & imaging results that were available during my care of the patient were reviewed by me and considered in my medical decision making (see chart for details).    Mobic as directed, ice compress, elevation, wrist splint during activity. Return precautions given. Patient expresses understanding and agrees to plan.  Final Clinical Impressions(s) / UC Diagnoses   Final diagnoses:  Left wrist pain    ED Prescriptions    Medication Sig Dispense Auth. Provider   meloxicam (MOBIC) 7.5 MG tablet Take 1 tablet (7.5 mg total) by mouth daily. 15 tablet Threasa Alpha, [place] 08/09/17 1736

## 2019-04-14 ENCOUNTER — Emergency Department (HOSPITAL_COMMUNITY)
Admission: EM | Admit: 2019-04-14 | Discharge: 2019-04-14 | Disposition: A | Discharge status: Home / Self Care | Payer: Self-pay | Attending: Emergency Medicine | Admitting: Emergency Medicine

## 2019-04-14 ENCOUNTER — Encounter (HOSPITAL_COMMUNITY): Payer: Self-pay | Admitting: Emergency Medicine

## 2019-04-14 ENCOUNTER — Other Ambulatory Visit: Payer: Self-pay

## 2019-04-14 ENCOUNTER — Emergency Department (HOSPITAL_COMMUNITY): Payer: Self-pay

## 2019-04-14 DIAGNOSIS — Z720 Tobacco use: Secondary | ICD-10-CM | POA: Insufficient documentation

## 2019-04-14 DIAGNOSIS — N200 Calculus of kidney: Secondary | ICD-10-CM

## 2019-04-14 DIAGNOSIS — N201 Calculus of ureter: Secondary | ICD-10-CM | POA: Insufficient documentation

## 2019-04-14 DIAGNOSIS — R112 Nausea with vomiting, unspecified: Secondary | ICD-10-CM | POA: Insufficient documentation

## 2019-04-14 LAB — URINALYSIS, ROUTINE W REFLEX MICROSCOPIC
Bacteria, UA: NONE SEEN
Bilirubin Urine: NEGATIVE
Glucose, UA: NEGATIVE mg/dL
Ketones, ur: NEGATIVE mg/dL
Leukocytes,Ua: NEGATIVE
Nitrite: NEGATIVE
Protein, ur: NEGATIVE mg/dL
Specific Gravity, Urine: 1.044 — ABNORMAL HIGH (ref 1.005–1.030)
pH: 7 (ref 5.0–8.0)

## 2019-04-14 LAB — CBC
HCT: 44.5 % (ref 39.0–52.0)
Hemoglobin: 14.5 g/dL (ref 13.0–17.0)
MCH: 29.2 pg (ref 26.0–34.0)
MCHC: 32.6 g/dL (ref 30.0–36.0)
MCV: 89.5 fL (ref 80.0–100.0)
Platelets: 276 10*3/uL (ref 150–400)
RBC: 4.97 MIL/uL (ref 4.22–5.81)
RDW: 12.7 % (ref 11.5–15.5)
WBC: 9 10*3/uL (ref 4.0–10.5)
nRBC: 0 % (ref 0.0–0.2)

## 2019-04-14 LAB — COMPREHENSIVE METABOLIC PANEL
ALT: 13 U/L (ref 0–44)
AST: 18 U/L (ref 15–41)
Albumin: 4.1 g/dL (ref 3.5–5.0)
Alkaline Phosphatase: 66 U/L (ref 38–126)
Anion gap: 8 (ref 5–15)
BUN: 11 mg/dL (ref 6–20)
CO2: 29 mmol/L (ref 22–32)
Calcium: 9.1 mg/dL (ref 8.9–10.3)
Chloride: 104 mmol/L (ref 98–111)
Creatinine, Ser: 1.13 mg/dL (ref 0.61–1.24)
GFR calc Af Amer: >60 mL/min (ref >60)
GFR calc non Af Amer: >60 mL/min (ref >60)
Glucose, Bld: 135 mg/dL — ABNORMAL HIGH (ref 70–99)
Potassium: 3.8 mmol/L (ref 3.5–5.1)
Sodium: 141 mmol/L (ref 135–145)
Total Bilirubin: 0.5 mg/dL (ref 0.3–1.2)
Total Protein: 6.9 g/dL (ref 6.5–8.1)

## 2019-04-14 LAB — LIPASE, BLOOD: Lipase: 23 U/L (ref 11–51)

## 2019-04-14 MED ORDER — MORPHINE SULFATE (PF) 4 MG/ML IV SOLN
4.0000 mg | Freq: Once | INTRAVENOUS | Status: AC
  Administered 2019-04-14: 4 mg via INTRAVENOUS
  Filled 2019-04-14: qty 1

## 2019-04-14 MED ORDER — TAMSULOSIN HCL 0.4 MG PO CAPS
0.4000 mg | ORAL_CAPSULE | Freq: Once | ORAL | Status: AC
  Administered 2019-04-14: 0.4 mg via ORAL
  Filled 2019-04-14: qty 1

## 2019-04-14 MED ORDER — TAMSULOSIN HCL 0.4 MG PO CAPS: 0.4000 mg | ORAL_CAPSULE | Freq: Every day | ORAL | 0 refills | Status: DC

## 2019-04-14 MED ORDER — KETOROLAC TROMETHAMINE 15 MG/ML IJ SOLN
15.0000 mg | Freq: Once | INTRAMUSCULAR | Status: AC
  Administered 2019-04-14: 15 mg via INTRAMUSCULAR
  Filled 2019-04-14: qty 1

## 2019-04-14 MED ORDER — SODIUM CHLORIDE 0.9% FLUSH
3.0000 mL | Freq: Once | INTRAVENOUS | Status: AC
  Administered 2019-04-14: 3 mL via INTRAVENOUS

## 2019-04-14 MED ORDER — ONDANSETRON HCL 4 MG/2ML IJ SOLN
4.0000 mg | Freq: Once | INTRAMUSCULAR | Status: AC
  Administered 2019-04-14: 4 mg via INTRAVENOUS
  Filled 2019-04-14: qty 2

## 2019-04-14 MED ORDER — SODIUM CHLORIDE 0.9 % IV BOLUS
1000.0000 mL | Freq: Once | INTRAVENOUS | Status: AC
  Administered 2019-04-14: 11:00:00 1000 mL via INTRAVENOUS

## 2019-04-14 MED ORDER — ONDANSETRON 4 MG PO TBDP: 4.0000 mg | ORAL_TABLET | Freq: Three times a day (TID) | ORAL | 0 refills | Status: AC | PRN

## 2019-04-14 MED ORDER — HYDROCODONE-ACETAMINOPHEN 5-325 MG PO TABS: 1.0000 | ORAL_TABLET | Freq: Four times a day (QID) | ORAL | 0 refills | Status: DC | PRN

## 2019-04-14 MED ORDER — IOHEXOL 300 MG/ML  SOLN
100.0000 mL | Freq: Once | INTRAMUSCULAR | Status: AC | PRN
  Administered 2019-04-14: 100 mL via INTRAVENOUS

## 2019-04-14 NOTE — ED Notes (Signed)
Attempted two IV starts on the left [facility] and one in the left hand. Pt states he is a difficult Iv start and will not allow me to try any further attempts.

## 2019-04-14 NOTE — ED Provider Notes (Signed)
[name] EMERGENCY DEPARTMENT Provider Note   CSN: [id number] Arrival date & time: 04/14/19  9381     History Chief Complaint  Patient presents with  . Abdominal Pain  . Emesis  . Testicle Pain    Bryce Baxter is a 44 y.o. male otherwise healthy no daily medication use presents today for right lower quadrant abdominal pain that began this morning around 5 AM.  Pain woke him from his sleep, severe intensity sharp throbbing sensation waxing and waning no clear aggravating or alleviating factors.  No medications prior to arrival.  No similar pain in the past.  Reports pain occasionally radiates down towards his right testicle.  Associated with nausea and nonbloody/nonbilious emesis  Denies fever/chills, headache, chest pain/shortness of breath, fall/injury,, dysuria/hematuria, testicular pain/swelling, penile discharge, numbness/tingling, weakness, bowel/bladder incontinence, urinary retention, saddle or paresthesias or any additional concerns.  HPI     History reviewed. No pertinent past medical history.  There are no problems to display for this patient.   Past Surgical History:  Procedure Laterality Date  . DENTAL SURGERY    . TONSILLECTOMY         Family History  Problem Relation Age of Onset  . Hypertension Mother     Social History   Tobacco Use  . Smoking status: Current Some Day Smoker    Packs/day: 1.00    Years: 10.00    Pack years: 10.00    Types: Cigarettes  . Smokeless tobacco: Never Used  Substance Use Topics  . Alcohol use: No  . Drug use: Yes    Types: Marijuana    Home Medications Prior to Admission medications   Medication Sig Start Date End Date Taking? Authorizing Provider  HYDROcodone-acetaminophen (NORCO/VICODIN) 5-325 MG tablet Take 1-2 tablets by mouth every 6 (six) hours as needed. 04/14/19   Nuala Alpha A, PA-C  hydrOXYzine (ATARAX/VISTARIL) 25 MG tablet Take 1 tablet (25 mg total) by mouth every 6 (six)  hours. Patient not taking: Reported on 08/24/2015 08/31/14   Delos Haring, PA-C  ibuprofen (ADVIL,MOTRIN) 800 MG tablet Take 1 tablet (800 mg total) by mouth 3 (three) times daily. Patient not taking: Reported on 04/14/2019 04/14/16   Barnet Glasgow, NP  meloxicam (MOBIC) 7.5 MG tablet Take 1 tablet (7.5 mg total) by mouth daily. Patient not taking: Reported on 04/14/2019 08/09/17   Ok Edwards, PA-C  neomycin-polymyxin-hydrocortisone (CORTISPORIN) 3.5-10000-1 otic suspension Place 4 drops into the right ear 3 (three) times daily. Patient not taking: Reported on 08/09/2017 08/24/15   Billy Fischer, MD  ondansetron (ZOFRAN ODT) 4 MG disintegrating tablet Take 1 tablet (4 mg total) by mouth every 8 (eight) hours as needed for up to 3 days for nausea or vomiting. 04/14/19 04/17/19  Nuala Alpha A, PA-C  tamsulosin (FLOMAX) 0.4 MG CAPS capsule Take 1 capsule (0.4 mg total) by mouth daily after breakfast. 04/14/19   Deliah Boston, PA-C  valACYclovir (VALTREX) 1000 MG tablet Take 2 tablets (2,000 mg total) by mouth 2 (two) times daily. Patient not taking: Reported on 08/24/2015 03/11/15   Billy Fischer, MD    Allergies    Patient has no known allergies.  Review of Systems   Review of Systems Ten systems are reviewed and are negative for acute change except as noted in the HPI  Physical Exam Updated Vital Signs BP (!) 149/86 (BP Location: Left Arm)   Pulse 60   Temp 98 F (36.7 C) (Oral)   Resp 17  Ht 6' (1.829 m)   Wt 79.4 kg   SpO2 99%   BMI 23.73 kg/m   Physical Exam Constitutional:      General: He is not in acute distress.    Appearance: Normal appearance. He is well-developed. He is not ill-appearing or diaphoretic.  HENT:     Head: Normocephalic and atraumatic.     Right Ear: External ear normal.     Left Ear: External ear normal.     Nose: Nose normal.  Eyes:     General: Vision grossly intact. Gaze aligned appropriately.     Pupils: Pupils are equal, round, and reactive to  light.  Neck:     Trachea: Trachea and phonation normal. No tracheal deviation.  Pulmonary:     Effort: Pulmonary effort is normal. No respiratory distress.  Abdominal:     General: There is no distension.     Palpations: Abdomen is soft.     Tenderness: There is abdominal tenderness in the right lower quadrant. There is guarding. There is no rebound. Positive signs include McBurney's sign. Negative signs include Murphy's sign and Rovsing's sign.  Genitourinary:    Comments: Chaperone present during genital exam Chief Executive Officer.  No external genital lesions noted, no bumps on head of penis, specifically no vesicles concerning for herpes or chancre suggestive of syphilis.  No pain with palpation of the penis/glans, no discharge or urethritis noted.  Scrotum and testicles without erythema/swelling or tenderness to palpation. Cremasteric reflex intact bilaterally. No palpable hernia noted. Musculoskeletal:        General: Normal range of motion.     Cervical back: Normal range of motion.     Comments: No midline C/T/L spinal tenderness to palpation, no paraspinal muscle tenderness, no deformity, crepitus, or step-off noted. No sign of injury to the neck or back.  Skin:    General: Skin is warm and dry.  Neurological:     Mental Status: He is alert.     GCS: GCS eye subscore is 4. GCS verbal subscore is 5. GCS motor subscore is 6.     Comments: Speech is clear and goal oriented, follows commands Major Cranial nerves without deficit, no facial droop Moves extremities without ataxia, coordination intact  Psychiatric:        Behavior: Behavior normal.    ED Results / Procedures / Treatments   Labs (all labs ordered are listed, but only abnormal results are displayed) Labs Reviewed  COMPREHENSIVE METABOLIC PANEL - Abnormal; Notable for the following components:      Result Value   Glucose, Bld 135 (*)    All other components within normal limits  URINALYSIS, ROUTINE W REFLEX MICROSCOPIC  - Abnormal; Notable for the following components:   Specific Gravity, Urine 1.044 (*)    Hgb urine dipstick MODERATE (*)    All other components within normal limits  LIPASE, BLOOD  CBC    EKG None  Radiology CT ABDOMEN PELVIS W CONTRAST  Result Date: 04/14/2019 CLINICAL DATA:  Right lower quadrant abdominal pain. EXAM: CT ABDOMEN AND PELVIS WITH CONTRAST TECHNIQUE: Multidetector CT imaging of the abdomen and pelvis was performed using the standard protocol following bolus administration of intravenous contrast. CONTRAST:  OMNIPAQUE IOHEXOL 300 MG/ML  SOLN COMPARISON:  None. FINDINGS: Lower chest: Minimal bilateral dependent atelectasis. Hepatobiliary: Multiple small liver cysts. Normal appearing gallbladder. Pancreas: Unremarkable. No pancreatic ductal dilatation or surrounding inflammatory changes. Spleen: Normal in size without focal abnormality. Adrenals/Urinary Tract: Normal appearing adrenal glands. Mild-to-moderate dilatation  of the right renal collecting system and proximal ureter to the level of a 4 mm proximal right ureteral calculus. There is associated delayed excretion of contrast by the right kidney with a mild-to-moderate amount of right perinephric fluid with no fluid collections suspicious for abscess. Simple cyst in the lower pole of the left kidney. Unremarkable urinary bladder and left ureter. Stomach/Bowel: Stomach is within normal limits. Appendix appears normal. No evidence of bowel wall thickening, distention, or inflammatory changes. Vascular/Lymphatic: No significant vascular findings are present. No enlarged abdominal or pelvic lymph nodes. Reproductive: Mildly enlarged prostate gland. Other: Small umbilical hernia containing fat. Musculoskeletal: Mild lumbar and lower thoracic spine degenerative changes. IMPRESSION: 1. 4 mm proximal right ureteral calculus causing mild-to-moderate right hydronephrosis and delayed excretion of contrast by the right kidney with associated  perinephric fluid. 2. Small umbilical hernia containing fat. Electronically Signed   By: Beckie Salts M.D.   On: 04/14/2019 12:06    Procedures Procedures (including critical care time)  Medications Ordered in ED Medications  sodium chloride flush (NS) 0.9 % injection 3 mL (3 mLs Intravenous Given 04/14/19 1117)  sodium chloride 0.9 % bolus 1,000 mL (0 mLs Intravenous Stopped 04/14/19 1215)  morphine 4 MG/ML injection 4 mg (4 mg Intravenous Given 04/14/19 1114)  ondansetron (ZOFRAN) injection 4 mg (4 mg Intravenous Given 04/14/19 1114)  iohexol (OMNIPAQUE) 300 MG/ML solution 100 mL (100 mLs Intravenous Contrast Given 04/14/19 1135)  ketorolac (TORADOL) 15 MG/ML injection 15 mg (15 mg Intramuscular Given 04/14/19 1241)  tamsulosin (FLOMAX) capsule 0.4 mg (0.4 mg Oral Given 04/14/19 1241)    ED Course  I have reviewed the triage vital signs and the nursing notes.  Pertinent labs & imaging results that were available during my care of the patient were reviewed by me and considered in my medical decision making (see chart for details).  Clinical Course as of Apr 14 1407  Wynelle Link Apr 14, 2019  1003 Victorino Dike RN   [BM]    Clinical Course User Index [BM] Elizabeth Palau   MDM Rules/Calculators/A&P                     44 year old male otherwise healthy no daily medication use presents today for sudden onset right lower quadrant pain radiating down towards his right testicle began around 5 AM this morning with associated nausea and nonbloody/nonbilious emesis.  No recent illness, fever/chills or injury.  Denies any testicular swelling hematuria dysuria or penile discharge.  On exam he is uncomfortable appearing, holding his right lower quadrant.  Heart regular rate and rhythm, lungs clear, abdomen tender right lower quadrant negative Murphy sign, positive McBurney's point, negative Rovsing's.  GU examination unremarkable no testicular pain or swelling.  Low suspicion for torsion or dissection at this  time, higher suspicion for appendicitis versus kidney stone.  Will obtain CT abdomen pelvis, basic blood work and give pain medication. - Urinalysis with hemoglobin Lipase within normal limits CBC within normal limits CMP shows glucose 135 otherwise within normal limits CT abdomen pelvis:  IMPRESSION:  1. 4 mm proximal right ureteral calculus causing mild-to-moderate  right hydronephrosis and delayed excretion of contrast by the right  kidney with associated perinephric fluid.  2. Small umbilical hernia containing fat.  - Patient was given IV fluids, morphine and Zofran.  On reassessment sleeping easily arousable to voice reports resolution of pain.  Suspect earlier testicular pain was referred from kidney stone, no negation for ultrasound of the scrotum at this  time.  He states understanding of findings on CT scan today and has no questions.  Lab work reviewed, suspect hemoglobin and urine secondary to stone disease there is no evidence of urinary tract infection today.  Additionally CBC shows no evidence of anemia or signs of infection.  CMP without acute electrolyte derangement or evidence of kidney injury or elevation of LFTs.  Lipase within normal limits no evidence of pancreatitis.  Patient denies any history of CKD or gastric ulcers will be given 15 mg Toradol to help with pain secondary to kidney stone.  Will start patient on tamsulosin.  Will discharge with nausea medication and pain medication.  PDMP reviewed patient's most recent narcotic medication prescription was from April 2020.  Feels reasonable to give patient short course, 6 pills, of Norco at this time for acute kidney stone pain.  Patient given referral to urology for follow-up.  At this time there does not appear to be any evidence of an acute emergency medical condition and the patient appears stable for discharge with appropriate outpatient follow up. Diagnosis was discussed with patient who verbalizes understanding of care plan  and is agreeable to discharge. I have discussed return precautions with patient who verbalizes understanding of return precautions. Patient encouraged to follow-up with their PCP and urology. All questions answered.  Patient's case discussed with Dr. Lockie Mola who agrees with plan to discharge with follow-up.   Note: Portions of this report may have been transcribed using voice recognition software. Every effort was made to ensure accuracy; however, inadvertent computerized transcription errors may still be present. Final Clinical Impression(s) / ED Diagnoses Final diagnoses:  Kidney stone    Rx / DC Orders ED Discharge Orders         Ordered    HYDROcodone-acetaminophen (NORCO/VICODIN) 5-325 MG tablet  Every 6 hours PRN     04/14/19 1408    ondansetron (ZOFRAN ODT) 4 MG disintegrating tablet  Every 8 hours PRN     04/14/19 1408    tamsulosin (FLOMAX) 0.4 MG CAPS capsule  Daily after breakfast     04/14/19 1408           Bill Salinas, PA-C 04/14/19 1410    Virgina Norfolk, DO 04/14/19 1621

## 2019-04-14 NOTE — ED Triage Notes (Signed)
Pt states that he has RLQ pain with pain to his lower scrotum as well as nausea and vomiting that began this am. He reports similar symptoms a few weeks ago that resolved on their own. Pt denies any swelling to his groin, reports last BM was yesterday-normal but states he is unable to urinate or defecate at this time. Denies any specific injuries or trauma.

## 2019-04-14 NOTE — ED Notes (Signed)
Pt returned from CT at this time. Placed pt back on monitor, pt placed in position of comfort and advised of wait status.   

## 2019-04-14 NOTE — ED Notes (Signed)
Pt discharged at this time. Discharge instructions reviewed, opportunity to ask questions provided, pt verbalizes understanding of discharge instructions.  Referrals discussed and prescriptions reviewed.

## 2019-04-14 NOTE — Discharge Instructions (Addendum)
You have been diagnosed today with Kidney Stone.  At this time there does not appear to be the presence of an emergent medical condition, however there is always the potential for conditions to change. Please read and follow the below instructions.  Please return to the Emergency Department immediately for any new or worsening symptoms or if your symptoms do not improve within 2 days. Please be sure to follow up with your Primary Care Provider within one week regarding your visit today; please call their office to schedule an appointment even if you are feeling better for a follow-up visit. You may take the pain medication Norco as prescribed for severe pain.  Do not drive or perform any dangerous activities while taking Norco as this will make you drowsy.  Do not drink alcohol or take any sedating medications with Norco as this will worsen side effects. Please take the medication Flomax as prescribed to help facilitate stone passage. You may use the medication Zofran as prescribed to help with nausea. You have been given an NSAID-containing medication called Toradol today.  Do not take the medications including ibuprofen, Aleve, Advil, naproxen or other NSAID-containing medications for the next 2 days.  Please be sure to drink plenty of water over the next few days. Please drink plenty of water and get plenty of rest. Please call the urologist Dr. Annabell Howells on your discharge paperwork today to schedule a follow-up appointment. Your CT scan showed small liver cysts, minimal bilateral dependent atelectasis, left kidney cyst, mildly enlarged prostate gland, a small umbilical fat-containing hernia and mild lumbar and lower thoracic spine degenerative changes.  Please discuss these incidental findings with your primary care provider at your follow-up visit.  You may view your results in full on your MyChart account and discussed them with your primary care provider.  Get help right away if: You have a fever  or chills. You develop severe pain. You develop new abdominal pain. You faint. You are unable to urinate. You have any new/concerning or worsening of symptoms  Please read the additional information packets attached to your discharge summary.  Do not take your medicine if  develop an itchy rash, swelling in your mouth or lips, or difficulty breathing; call 911 and seek immediate emergency medical attention if this occurs.  Note: Portions of this text may have been transcribed using voice recognition software. Every effort was made to ensure accuracy; however, inadvertent computerized transcription errors may still be present.

## 2019-04-14 NOTE — ED Notes (Signed)
Pt transported to CT via stretcher at this time.  

## 2019-04-25 ENCOUNTER — Encounter (HOSPITAL_COMMUNITY): Payer: Self-pay

## 2019-04-25 ENCOUNTER — Ambulatory Visit (HOSPITAL_COMMUNITY)
Admission: EM | Admit: 2019-04-25 | Discharge: 2019-04-25 | Disposition: A | Discharge status: Home / Self Care | Payer: Self-pay | Attending: Family Medicine | Admitting: Family Medicine

## 2019-04-25 ENCOUNTER — Other Ambulatory Visit: Payer: Self-pay

## 2019-04-25 DIAGNOSIS — N200 Calculus of kidney: Secondary | ICD-10-CM

## 2019-04-25 LAB — POCT URINALYSIS DIP (DEVICE)
Bilirubin Urine: NEGATIVE
Glucose, UA: NEGATIVE mg/dL
Hgb urine dipstick: NEGATIVE
Ketones, ur: NEGATIVE mg/dL
Leukocytes,Ua: NEGATIVE
Nitrite: NEGATIVE
Protein, ur: NEGATIVE mg/dL
Specific Gravity, Urine: >=1.03 (ref 1.005–1.030)
Urobilinogen, UA: 0.2 mg/dL (ref 0.0–1.0)
pH: 5.5 (ref 5.0–8.0)

## 2019-04-25 NOTE — ED Provider Notes (Signed)
  [facility]    ASSESSMENT & PLAN:  1. Nephrolithiasis     Appears to have passed stone. U/A normal. Work note provided. May f/u as needed.  Outlined signs and symptoms indicating need for more acute intervention. Patient verbalized understanding. After Visit Summary given.  SUBJECTIVE: Seen in ED on 04/14/2019 and dx with kidney stone; note reviewed by me.  Bryce Baxter is a 44 y.o. male who is here for f/u after dx with kidney stone. Feeling much better. No abdominal or flank pain. Normal bowel/bladder habits. Normal PO intake without n/v. Afebrile. He does need a work note.  OBJECTIVE:  Vitals:   04/25/19 1142 04/25/19 1143  BP: 128/80   Pulse: 64   Resp: 18   Temp: 98 F (36.7 C)   TempSrc: Oral   SpO2: 98%   Weight:  80.2 kg   General appearance: alert; no distress HENT: oropharynx: moist Lungs: unlabored respirations Back: no CVA tenderness reported Skin: warm and dry Neurologic: normal gait Psychological: alert and cooperative; normal mood and affect  Labs Reviewed  POCT URINALYSIS DIP (DEVICE)    No Known Allergies  History reviewed. No pertinent past medical history.   Social History   Socioeconomic History  . Marital status: Single    Spouse name: Not on file  . Number of children: Not on file  . Years of education: Not on file  . Highest education level: Not on file  Occupational History  . Not on file  Tobacco Use  . Smoking status: Current Some Day Smoker    Packs/day: 1.00    Years: 10.00    Pack years: 10.00    Types: Cigarettes  . Smokeless tobacco: Never Used  Substance and Sexual Activity  . Alcohol use: No  . Drug use: Yes    Types: Marijuana  . Sexual activity: Not on file  Other Topics Concern  . Not on file  Social History Narrative  . Not on file   Social Determinants of Health   Financial Resource Strain:   . Difficulty of Paying Living Expenses:   Food Insecurity:   . Worried About Brewing technologist in the Last Year:   . Barista in the Last Year:   Transportation Needs:   . Freight forwarder (Medical):   Marland Kitchen Lack of Transportation (Non-Medical):   Physical Activity:   . Days of Exercise per Week:   . Minutes of Exercise per Session:   Stress:   . Feeling of Stress :   Social Connections:   . Frequency of Communication with Friends and Family:   . Frequency of Social Gatherings with Friends and Family:   . Attends Religious Services:   . Active Member of Clubs or Organizations:   . Attends Banker Meetings:   Marland Kitchen Marital Status:   Intimate Partner Violence:   . Fear of Current or Ex-Partner:   . Emotionally Abused:   Marland Kitchen Physically Abused:   . Sexually Abused:    Family History  Problem Relation Age of Onset  . Hypertension Mother        Mardella Layman, MD 04/25/19 1336

## 2019-04-25 NOTE — ED Triage Notes (Addendum)
Pt state he has a kidney stone . Pt states he here for a follow up after going to the ER last week.

## 2019-06-08 DIAGNOSIS — N200 Calculus of kidney: Secondary | ICD-10-CM

## 2019-06-08 HISTORY — DX: Calculus of kidney: N20.0

## 2019-06-10 ENCOUNTER — Encounter (HOSPITAL_COMMUNITY): Payer: Self-pay

## 2019-06-10 ENCOUNTER — Ambulatory Visit (HOSPITAL_COMMUNITY)
Admission: EM | Admit: 2019-06-10 | Discharge: 2019-06-10 | Disposition: A | Discharge status: Home / Self Care | Payer: Self-pay | Attending: Physician Assistant | Admitting: Physician Assistant

## 2019-06-10 ENCOUNTER — Other Ambulatory Visit: Payer: Self-pay

## 2019-06-10 DIAGNOSIS — M545 Low back pain, unspecified: Secondary | ICD-10-CM

## 2019-06-10 MED ORDER — TIZANIDINE HCL 4 MG PO TABS: 4.0000 mg | ORAL_TABLET | Freq: Every day | ORAL | 0 refills | Status: AC

## 2019-06-10 MED ORDER — IBUPROFEN 600 MG PO TABS: 600.0000 mg | ORAL_TABLET | Freq: Four times a day (QID) | ORAL | 0 refills | Status: DC | PRN

## 2019-06-10 NOTE — ED Provider Notes (Signed)
[facility]    CSN: [id number] Arrival date & time: 06/10/19  1142      History   Chief Complaint Chief Complaint  Patient presents with  . Back Pain    HPI Bryce Baxter is a 44 y.o. male.   Patient presents for 1 day history of right-sided low back pain.  He reports yesterday at work he began having onset of right-sided lower back pain that is progressed to be fairly significant today.  He reports it is lower level throughout the evening yesterday but awoke this morning with fairly severe pain.  He is able to point directly to the area of pain in his low back.  Reports pain is increased with certain movements such as bending and twisting.  Reports it does not radiate around to his abdomen or down his leg.  Denies any numbness or tingling in his lower extremities.  Denies any weakness.  He was concerned as he had a recent history of kidney stone and was wondering if this could be related to that.  Denies any painful urination or frequency urination.  No blood in his urine.  Of note patient had a 4 mm stone found on CT on 04/14/2019.  He passed this shortly after.     Past Medical History:  Diagnosis Date  . Kidney stones     There are no problems to display for this patient.   Past Surgical History:  Procedure Laterality Date  . DENTAL SURGERY    . TONSILLECTOMY         Home Medications    Prior to Admission medications   Medication Sig Start Date End Date Taking? Authorizing Provider  HYDROcodone-acetaminophen (NORCO/VICODIN) 5-325 MG tablet Take 1-2 tablets by mouth every 6 (six) hours as needed. 04/14/19   Harlene Salts A, PA-C  ibuprofen (ADVIL) 600 MG tablet Take 1 tablet (600 mg total) by mouth every 6 (six) hours as needed. 06/10/19   Keanthony Poole, Veryl Speak, PA-C  tamsulosin (FLOMAX) 0.4 MG CAPS capsule Take 1 capsule (0.4 mg total) by mouth daily after breakfast. 04/14/19   Harlene Salts A, PA-C  tiZANidine (ZANAFLEX) 4 MG tablet Take 1 tablet (4 mg total)  by mouth at bedtime for 10 days. 06/10/19 06/20/19  Jenalee Trevizo, Veryl Speak, PA-C    Family History Family History  Problem Relation Age of Onset  . Hypertension Mother     Social History Social History   Tobacco Use  . Smoking status: Current Some Day Smoker    Packs/day: 1.00    Years: 10.00    Pack years: 10.00    Types: Cigarettes  . Smokeless tobacco: Never Used  Substance Use Topics  . Alcohol use: No  . Drug use: Yes    Types: Marijuana     Allergies   Patient has no known allergies.   Review of Systems Review of Systems   Physical Exam Triage Vital Signs ED Triage Vitals  Enc Vitals Group     BP 06/10/19 1240 118/78     Pulse Rate 06/10/19 1240 (!) 59     Resp 06/10/19 1240 18     Temp 06/10/19 1240 97.6 F (36.4 C)     Temp Source 06/10/19 1240 Oral     SpO2 06/10/19 1240 100 %     Weight 06/10/19 1241 176 lb (79.8 kg)     Height 06/10/19 1241 6' (1.829 m)     Head Circumference --      Peak Flow --  Pain Score 06/10/19 1240 10     Pain Loc --      Pain Edu? --      Excl. in GC? --    No data found.  Updated Vital Signs BP 118/78   Pulse (!) 59   Temp 97.6 F (36.4 C) (Oral)   Resp 18   Ht 6' (1.829 m)   Wt 176 lb (79.8 kg)   SpO2 100%   BMI 23.87 kg/m   Visual Acuity Right Eye Distance:   Left Eye Distance:   Bilateral Distance:    Right Eye Near:   Left Eye Near:    Bilateral Near:     Physical Exam Vitals and nursing note reviewed.  Constitutional:      Appearance: He is well-developed.  HENT:     Head: Normocephalic and atraumatic.  Eyes:     Conjunctiva/sclera: Conjunctivae normal.  Cardiovascular:     Rate and Rhythm: Normal rate.     Heart sounds: No murmur.  Pulmonary:     Effort: Pulmonary effort is normal. No respiratory distress.  Musculoskeletal:     Cervical back: Neck supple.     Comments: Tenderness to palpation of the right sided lower paraspinals at the SI junction.  Patient has full range of motion of the  spine.  Some pain elicited with forward flexion.  No pain with hyperextension spine.  Straight leg raise negative bilaterally.  Strength is 5/5 throughout the lower extremities.  Skin:    General: Skin is warm and dry.  Neurological:     Mental Status: He is alert.      UC Treatments / Results  Labs (all labs ordered are listed, but only abnormal results are displayed) Labs Reviewed - No data to display  EKG   Radiology No results found.  Procedures Procedures (including critical care time)  Medications Ordered in UC Medications - No data to display  Initial Impression / Assessment and Plan / UC Course  I have reviewed the triage vital signs and the nursing notes.  Pertinent labs & imaging results that were available during my care of the patient were reviewed by me and considered in my medical decision making (see chart for details).     #Acute right-sided low back pain Patient is a 44 year old presenting with acute right-sided low back pain.  Believe this is musculoskeletal.  No evidence of sciatica.  Will treat with NSAIDs and muscle relaxer at night.  Patient verbalized understanding.  Return and fall precautions were discussed.  Final Clinical Impressions(s) / UC Diagnoses   Final diagnoses:  Acute right-sided low back pain without sciatica     Discharge Instructions     Take the ibuprofen every 6 hours for the next 2 to 3 days and then as needed Use the Zanaflex at night.  This will make you sleepy so do not drive or go to work within 6 to 8 hours of taking this  If not improving over the next 3 to 5 days please follow-up with this clinic or your primary care.  Continue good hydration as discussed.      ED Prescriptions    Medication Sig Dispense Auth. Provider   tiZANidine (ZANAFLEX) 4 MG tablet Take 1 tablet (4 mg total) by mouth at bedtime for 10 days. 10 tablet Shams Fill, Veryl Speak, PA-C   ibuprofen (ADVIL) 600 MG tablet Take 1 tablet (600 mg total) by  mouth every 6 (six) hours as needed. 30 tablet Jalesia Loudenslager, Veryl Speak, PA-C  PDMP not reviewed this encounter.   Purnell Shoemaker, PA-C 06/10/19 1447

## 2019-06-10 NOTE — Discharge Instructions (Signed)
Take the ibuprofen every 6 hours for the next 2 to 3 days and then as needed Use the Zanaflex at night.  This will make you sleepy so do not drive or go to work within 6 to 8 hours of taking this  If not improving over the next 3 to 5 days please follow-up with this clinic or your primary care.  Continue good hydration as discussed.

## 2019-06-10 NOTE — ED Triage Notes (Addendum)
Pt c/o 10/10 sharp pain in right lower back since yesterday. Pt states certain ways he moves or twists he gets the pain in his back. PT denies numbness and tingling. Pt denies loss of bowel or bladder. Pt walked well to exam room.

## 2019-07-03 ENCOUNTER — Emergency Department (HOSPITAL_COMMUNITY): Payer: Self-pay

## 2019-07-03 ENCOUNTER — Encounter (HOSPITAL_COMMUNITY): Payer: Self-pay

## 2019-07-03 ENCOUNTER — Other Ambulatory Visit: Payer: Self-pay

## 2019-07-03 ENCOUNTER — Ambulatory Visit (HOSPITAL_COMMUNITY)
Admission: EM | Admit: 2019-07-03 | Discharge: 2019-07-03 | Disposition: A | Discharge status: Home / Self Care | Payer: Self-pay | Attending: Urgent Care | Admitting: Urgent Care

## 2019-07-03 ENCOUNTER — Emergency Department (HOSPITAL_COMMUNITY)
Admission: EM | Admit: 2019-07-03 | Discharge: 2019-07-03 | Disposition: A | Discharge status: Home / Self Care | Payer: Self-pay | Attending: Emergency Medicine | Admitting: Emergency Medicine

## 2019-07-03 DIAGNOSIS — N23 Unspecified renal colic: Secondary | ICD-10-CM

## 2019-07-03 DIAGNOSIS — R1110 Vomiting, unspecified: Secondary | ICD-10-CM

## 2019-07-03 DIAGNOSIS — N200 Calculus of kidney: Secondary | ICD-10-CM

## 2019-07-03 DIAGNOSIS — R112 Nausea with vomiting, unspecified: Secondary | ICD-10-CM | POA: Insufficient documentation

## 2019-07-03 DIAGNOSIS — N130 Hydronephrosis with ureteropelvic junction obstruction: Secondary | ICD-10-CM | POA: Insufficient documentation

## 2019-07-03 DIAGNOSIS — Z79899 Other long term (current) drug therapy: Secondary | ICD-10-CM | POA: Insufficient documentation

## 2019-07-03 DIAGNOSIS — F17210 Nicotine dependence, cigarettes, uncomplicated: Secondary | ICD-10-CM | POA: Insufficient documentation

## 2019-07-03 DIAGNOSIS — R109 Unspecified abdominal pain: Secondary | ICD-10-CM

## 2019-07-03 DIAGNOSIS — K7689 Other specified diseases of liver: Secondary | ICD-10-CM

## 2019-07-03 DIAGNOSIS — R319 Hematuria, unspecified: Secondary | ICD-10-CM

## 2019-07-03 DIAGNOSIS — N201 Calculus of ureter: Secondary | ICD-10-CM | POA: Insufficient documentation

## 2019-07-03 LAB — URINALYSIS, ROUTINE W REFLEX MICROSCOPIC
Bacteria, UA: NONE SEEN
Bilirubin Urine: NEGATIVE
Glucose, UA: NEGATIVE mg/dL
Ketones, ur: 20 mg/dL — AB
Leukocytes,Ua: NEGATIVE
Nitrite: NEGATIVE
Protein, ur: 30 mg/dL — AB
Specific Gravity, Urine: 1.02 (ref 1.005–1.030)
pH: 5 (ref 5.0–8.0)

## 2019-07-03 LAB — POCT URINALYSIS DIP (DEVICE)
Bilirubin Urine: NEGATIVE
Glucose, UA: NEGATIVE mg/dL
Ketones, ur: NEGATIVE mg/dL
Leukocytes,Ua: NEGATIVE
Nitrite: NEGATIVE
Protein, ur: 30 mg/dL — AB
Specific Gravity, Urine: >=1.03 (ref 1.005–1.030)
Urobilinogen, UA: 0.2 mg/dL (ref 0.0–1.0)
pH: 5 (ref 5.0–8.0)

## 2019-07-03 LAB — CBC WITH DIFFERENTIAL/PLATELET
Abs Immature Granulocytes: 0.02 10*3/uL (ref 0.00–0.07)
Basophils Absolute: 0.1 10*3/uL (ref 0.0–0.1)
Basophils Relative: 1 %
Eosinophils Absolute: 0 10*3/uL (ref 0.0–0.5)
Eosinophils Relative: 0 %
HCT: 42.6 % (ref 39.0–52.0)
Hemoglobin: 13.9 g/dL (ref 13.0–17.0)
Immature Granulocytes: 0 %
Lymphocytes Relative: 12 %
Lymphs Abs: 1.2 10*3/uL (ref 0.7–4.0)
MCH: 29.4 pg (ref 26.0–34.0)
MCHC: 32.6 g/dL (ref 30.0–36.0)
MCV: 90.1 fL (ref 80.0–100.0)
Monocytes Absolute: 0.4 10*3/uL (ref 0.1–1.0)
Monocytes Relative: 4 %
Neutro Abs: 8.4 10*3/uL — ABNORMAL HIGH (ref 1.7–7.7)
Neutrophils Relative %: 83 %
Platelets: 282 10*3/uL (ref 150–400)
RBC: 4.73 MIL/uL (ref 4.22–5.81)
RDW: 13.2 % (ref 11.5–15.5)
WBC: 10 10*3/uL (ref 4.0–10.5)
nRBC: 0 % (ref 0.0–0.2)

## 2019-07-03 LAB — COMPREHENSIVE METABOLIC PANEL
ALT: 13 U/L (ref 0–44)
AST: 19 U/L (ref 15–41)
Albumin: 4.3 g/dL (ref 3.5–5.0)
Alkaline Phosphatase: 54 U/L (ref 38–126)
Anion gap: 8 (ref 5–15)
BUN: 11 mg/dL (ref 6–20)
CO2: 25 mmol/L (ref 22–32)
Calcium: 9.3 mg/dL (ref 8.9–10.3)
Chloride: 104 mmol/L (ref 98–111)
Creatinine, Ser: 1.06 mg/dL (ref 0.61–1.24)
GFR calc Af Amer: >60 mL/min (ref >60)
GFR calc non Af Amer: >60 mL/min (ref >60)
Glucose, Bld: 138 mg/dL — ABNORMAL HIGH (ref 70–99)
Potassium: 4.3 mmol/L (ref 3.5–5.1)
Sodium: 137 mmol/L (ref 135–145)
Total Bilirubin: 0.3 mg/dL (ref 0.3–1.2)
Total Protein: 7 g/dL (ref 6.5–8.1)

## 2019-07-03 MED ORDER — KETOROLAC TROMETHAMINE 60 MG/2ML IM SOLN
INTRAMUSCULAR | Status: AC
  Filled 2019-07-03: qty 2

## 2019-07-03 MED ORDER — KETOROLAC TROMETHAMINE 30 MG/ML IJ SOLN
30.0000 mg | Freq: Once | INTRAMUSCULAR | Status: AC
  Administered 2019-07-03: 30 mg via INTRAVENOUS
  Filled 2019-07-03: qty 1

## 2019-07-03 MED ORDER — FENTANYL CITRATE (PF) 100 MCG/2ML IJ SOLN
50.0000 ug | INTRAMUSCULAR | Status: DC | PRN
  Administered 2019-07-03: 50 ug via NASAL
  Filled 2019-07-03 (×2): qty 2

## 2019-07-03 MED ORDER — ONDANSETRON 4 MG PO TBDP: 4.0000 mg | ORAL_TABLET | Freq: Three times a day (TID) | ORAL | 0 refills | Status: DC | PRN

## 2019-07-03 MED ORDER — HYDROCODONE-ACETAMINOPHEN 5-325 MG PO TABS
ORAL_TABLET | ORAL | Status: AC
  Filled 2019-07-03: qty 1

## 2019-07-03 MED ORDER — ONDANSETRON HCL 4 MG/2ML IJ SOLN
INTRAMUSCULAR | Status: AC
  Filled 2019-07-03: qty 2

## 2019-07-03 MED ORDER — KETOROLAC TROMETHAMINE 60 MG/2ML IM SOLN
60.0000 mg | Freq: Once | INTRAMUSCULAR | Status: AC
  Administered 2019-07-03: 60 mg via INTRAMUSCULAR

## 2019-07-03 MED ORDER — HYDROMORPHONE HCL 1 MG/ML IJ SOLN
1.0000 mg | Freq: Once | INTRAMUSCULAR | Status: AC
  Administered 2019-07-03: 1 mg via INTRAVENOUS
  Filled 2019-07-03: qty 1

## 2019-07-03 MED ORDER — HYDROCODONE-ACETAMINOPHEN 5-325 MG PO TABS: 2.0000 | ORAL_TABLET | ORAL | 0 refills | Status: DC | PRN

## 2019-07-03 MED ORDER — HYDROCODONE-ACETAMINOPHEN 5-325 MG PO TABS
1.0000 | ORAL_TABLET | Freq: Once | ORAL | Status: AC
  Administered 2019-07-03: 1 via ORAL

## 2019-07-03 MED ORDER — ONDANSETRON HCL 4 MG/2ML IJ SOLN
4.0000 mg | Freq: Once | INTRAMUSCULAR | Status: AC
  Administered 2019-07-03: 4 mg via INTRAMUSCULAR

## 2019-07-03 MED ORDER — ONDANSETRON HCL 4 MG/2ML IJ SOLN
4.0000 mg | Freq: Once | INTRAMUSCULAR | Status: AC
  Administered 2019-07-03: 4 mg via INTRAVENOUS
  Filled 2019-07-03: qty 2

## 2019-07-03 MED ORDER — ONDANSETRON 4 MG PO TBDP
4.0000 mg | ORAL_TABLET | Freq: Once | ORAL | Status: AC
  Administered 2019-07-03: 4 mg via ORAL
  Filled 2019-07-03: qty 1

## 2019-07-03 NOTE — ED Notes (Signed)
Urine specimen in lab, labeled, verified

## 2019-07-03 NOTE — ED Provider Notes (Signed)
Bryce Baxter   MRN: [id number] DOB: 08/04/1975  Subjective:   Bryce Baxter is a 44 y.o. male presenting for 1 day history of acute onset recurrent right lower abdominal pain with intermittent vomiting.  Patient was diagnosed with a kidney stone in the right ureter about a month ago.  Is been trying to hydrate well and has been taking medications prescribed but was not straining his urine to see if he passed it.  No current facility-administered medications for this encounter.  Current Outpatient Medications:  .  HYDROcodone-acetaminophen (NORCO/VICODIN) 5-325 MG tablet, Take 1-2 tablets by mouth every 6 (six) hours as needed., Disp: 6 tablet, Rfl: 0 .  ibuprofen (ADVIL) 600 MG tablet, Take 1 tablet (600 mg total) by mouth every 6 (six) hours as needed., Disp: 30 tablet, Rfl: 0 .  tamsulosin (FLOMAX) 0.4 MG CAPS capsule, Take 1 capsule (0.4 mg total) by mouth daily after breakfast., Disp: 10 capsule, Rfl: 0   No Known Allergies  Past Medical History:  Diagnosis Date  . Kidney stones      Past Surgical History:  Procedure Laterality Date  . DENTAL SURGERY    . TONSILLECTOMY      Family History  Problem Relation Age of Onset  . Hypertension Mother     Social History   Tobacco Use  . Smoking status: Current Some Day Smoker    Packs/day: 1.00    Years: 10.00    Pack years: 10.00    Types: Cigarettes  . Smokeless tobacco: Never Used  Substance Use Topics  . Alcohol use: No  . Drug use: Yes    Types: Marijuana    ROS   Objective:   Vitals: BP 130/84 (BP Location: Right Arm)   Pulse (!) 57   Temp 98.5 F (36.9 C) (Oral)   Resp 17   SpO2 100%   Physical Exam Constitutional:      General: He is not in acute distress.    Appearance: Normal appearance. He is well-developed and normal weight. He is not ill-appearing, toxic-appearing or diaphoretic.  HENT:     Head: Normocephalic and atraumatic.     Right Ear: External ear normal.     Left Ear:  External ear normal.     Nose: Nose normal.     Mouth/Throat:     Mouth: Mucous membranes are moist.     Pharynx: Oropharynx is clear.  Eyes:     General: No scleral icterus.       Right eye: No discharge.        Left eye: No discharge.     Extraocular Movements: Extraocular movements intact.     Pupils: Pupils are equal, round, and reactive to light.  Cardiovascular:     Rate and Rhythm: Normal rate and regular rhythm.     Heart sounds: Normal heart sounds. No murmur. No friction rub. No gallop.   Pulmonary:     Effort: Pulmonary effort is normal. No respiratory distress.     Breath sounds: Normal breath sounds. No stridor. No wheezing, rhonchi or rales.  Abdominal:     General: Bowel sounds are normal. There is no distension.     Palpations: Abdomen is soft. There is no mass.     Tenderness: There is abdominal tenderness (Right lower flank side) in the right lower quadrant. There is no right CVA tenderness, left CVA tenderness, guarding or rebound.     Hernia: No hernia is present.  Musculoskeletal:     Cervical back:  Normal range of motion.  Skin:    General: Skin is warm and dry.  Neurological:     Mental Status: He is alert and oriented to person, place, and time.  Psychiatric:        Mood and Affect: Mood normal.        Behavior: Behavior normal.        Thought Content: Thought content normal.        Judgment: Judgment normal.     Results for orders placed or performed during the hospital encounter of 07/03/19 (from the past 24 hour(s))  POCT urinalysis dip (device)     Status: Abnormal   Collection Time: 07/03/19 11:28 AM  Result Value Ref Range   Glucose, UA NEGATIVE NEGATIVE mg/dL   Bilirubin Urine NEGATIVE NEGATIVE   Ketones, ur NEGATIVE NEGATIVE mg/dL   Specific Gravity, Urine >=1.030 1.005 - 1.030   Hgb urine dipstick MODERATE (A) NEGATIVE   pH 5.0 5.0 - 8.0   Protein, ur 30 (A) NEGATIVE mg/dL   Urobilinogen, UA 0.2 0.0 - 1.0 mg/dL   Nitrite NEGATIVE  NEGATIVE   Leukocytes,Ua NEGATIVE NEGATIVE    Assessment and Plan :   PDMP not reviewed this encounter.  1. Renal colic on right side   2. Hematuria, unspecified type   3. Renal stone   4. Nausea and vomiting, intractability of vomiting not specified, unspecified vomiting type     Patient given IM toradol, IM Zofran, PO hydrocodone. Concern is for obstructing renal stone, possible hydronephrosis.  Patient is in need of a higher level of work-up than we can provide in the [facility] urgent care setting.  Recommended further evaluation and management in the [facility] emergency room. Counseled patient on potential for adverse effects with medications prescribed/recommended today, ER and return-to-clinic precautions discussed, patient verbalized understanding.    Wallis Bamberg, [place] 07/03/19 1554

## 2019-07-03 NOTE — ED Notes (Signed)
Pt had urine collected at [facility]

## 2019-07-03 NOTE — ED Triage Notes (Signed)
Pt presents with right side abdominal pain and intermittent vomiting; pt states he just passed kidney stones a few weeks ago.

## 2019-07-03 NOTE — Discharge Instructions (Signed)
I am concerned that you have an obstructing kidney stone. Given your severe pain, recent CT scan showing a right ureteral kidney stone with hydronephrosis you will need another CT renal scan, labs and more work up than we can provide in the urgent care. Please head straight there for management.

## 2019-07-03 NOTE — Discharge Instructions (Addendum)
Your CT scan showed a 5 mm kidney stone.  This is the cause of your pain.  Additionally, your CT scan showed some small liver cysts.  This was present on her previous CT scans.  There is nothing to do about these and these are most likely benign.  They just need to follow-up with primary care doctor with reevaluation.  You can take Tylenol or Ibuprofen as directed for pain. You can alternate Tylenol and Ibuprofen every 4 hours. If you take Tylenol at 1pm, then you can take Ibuprofen at 5pm. Then you can take Tylenol again at 9pm.   Take pain medications as directed for break through pain. Do not drive or operate machinery while taking this medication.   Take zofran for pain.   Follow up with Urology.   Return to emergency department for any worsening pain, nausea/vomiting, fevers or any other worsening or concerning symptoms.  As we discussed, follow-up with [facility].  Return the emergency department for any worsening abdominal pain, fevers, vomiting.

## 2019-07-03 NOTE — ED Provider Notes (Signed)
[facility] EMERGENCY DEPARTMENT Provider Note   CSN: [id number] Arrival date & time: 07/03/19  1220     History Chief Complaint  Patient presents with  . Flank Pain    Bryce Baxter is a 44 y.o. male who presents for evaluation of right flank and right abdominal pain that began this morning.  Associated with nausea/vomiting.  Reports that in March 2021, he had a right-sided kidney stone.  He reports that since then, he will get intermittent twinges of pain.  He had been doing well until this morning when he woke up and started having right sided abdominal pain.  He did not have any dysuria or hematuria.  He had associated nausea/vomiting.  He went to urgent care and was given some pain medication which he states helped his pain.  He was instructed to come to the emergency department for further evaluation.  He denies any fevers but has had some chills.  He denies any chest pain, difficulty breathing.  The history is provided by the patient.       Past Medical History:  Diagnosis Date  . Kidney stones     There are no problems to display for this patient.   Past Surgical History:  Procedure Laterality Date  . DENTAL SURGERY    . TONSILLECTOMY         Family History  Problem Relation Age of Onset  . Hypertension Mother     Social History   Tobacco Use  . Smoking status: Current Some Day Smoker    Packs/day: 1.00    Years: 10.00    Pack years: 10.00    Types: Cigarettes  . Smokeless tobacco: Never Used  Substance Use Topics  . Alcohol use: No  . Drug use: Yes    Types: Marijuana    Home Medications Prior to Admission medications   Medication Sig Start Date End Date Taking? Authorizing Provider  HYDROcodone-acetaminophen (NORCO/VICODIN) 5-325 MG tablet Take 2 tablets by mouth every 4 (four) hours as needed. 07/03/19   Maxwell Caul, PA-C  ibuprofen (ADVIL) 600 MG tablet Take 1 tablet (600 mg total) by mouth every 6 (six) hours as needed.  06/10/19   Darr, Veryl Speak, PA-C  ondansetron (ZOFRAN ODT) 4 MG disintegrating tablet Take 1 tablet (4 mg total) by mouth every 8 (eight) hours as needed for nausea or vomiting. 07/03/19   Maxwell Caul, PA-C  tamsulosin (FLOMAX) 0.4 MG CAPS capsule Take 1 capsule (0.4 mg total) by mouth daily after breakfast. 04/14/19   Bill Salinas, PA-C    Allergies    Patient has no known allergies.  Review of Systems   Review of Systems  Constitutional: Negative for fever.  Respiratory: Negative for cough and shortness of breath.   Cardiovascular: Negative for chest pain.  Gastrointestinal: Positive for abdominal pain, nausea and vomiting.  Genitourinary: Positive for flank pain. Negative for dysuria and hematuria.  Neurological: Negative for headaches.  All other systems reviewed and are negative.   Physical Exam Updated Vital Signs BP (!) 151/90 (BP Location: Right Arm)   Pulse 60   Temp 98.4 F (36.9 C) (Oral)   Resp 20   SpO2 99%   Physical Exam Vitals and nursing note reviewed.  Constitutional:      Appearance: Normal appearance. He is well-developed.  HENT:     Head: Normocephalic and atraumatic.  Eyes:     General: Lids are normal.     Conjunctiva/sclera: Conjunctivae normal.  Pupils: Pupils are equal, round, and reactive to light.  Cardiovascular:     Rate and Rhythm: Normal rate and regular rhythm.     Pulses: Normal pulses.     Heart sounds: Normal heart sounds. No murmur. No friction rub. No gallop.   Pulmonary:     Effort: Pulmonary effort is normal.     Breath sounds: Normal breath sounds.     Comments: Lungs clear to auscultation bilaterally.  Symmetric chest rise.  No wheezing, rales, rhonchi. Abdominal:     Palpations: Abdomen is soft. Abdomen is not rigid.     Tenderness: There is abdominal tenderness in the right lower quadrant. There is right CVA tenderness. There is no guarding.     Comments: Abdomen is soft, nondistended.  Tenderness palpation noted to  the right lower quadrant.  No focal tenderness noted McBurney's point.  Right-sided CVA tenderness noted.  Musculoskeletal:        General: Normal range of motion.     Cervical back: Full passive range of motion without pain.  Skin:    General: Skin is warm and dry.     Capillary Refill: Capillary refill takes less than 2 seconds.  Neurological:     Mental Status: He is alert and oriented to person, place, and time.  Psychiatric:        Speech: Speech normal.     ED Results / Procedures / Treatments   Labs (all labs ordered are listed, but only abnormal results are displayed) Labs Reviewed  URINALYSIS, ROUTINE W REFLEX MICROSCOPIC - Abnormal; Notable for the following components:      Result Value   APPearance HAZY (*)    Hgb urine dipstick MODERATE (*)    Ketones, ur 20 (*)    Protein, ur 30 (*)    All other components within normal limits  COMPREHENSIVE METABOLIC PANEL - Abnormal; Notable for the following components:   Glucose, Bld 138 (*)    All other components within normal limits  CBC WITH DIFFERENTIAL/PLATELET - Abnormal; Notable for the following components:   Neutro Abs 8.4 (*)    All other components within normal limits    EKG None  Radiology CT Renal Stone Study  Result Date: 07/03/2019 CLINICAL DATA:  Right-sided abdominal pain. Intermittent vomiting. Patient reports recently passed kidney stone. EXAM: CT ABDOMEN AND PELVIS WITHOUT CONTRAST TECHNIQUE: Multidetector CT imaging of the abdomen and pelvis was performed following the standard protocol without IV contrast. COMPARISON:  Abdominal CT 04/14/2019 FINDINGS: Lower chest: Lung bases are clear. Heart is normal in size. Hepatobiliary: Scattered hepatic hypodensities, less well-defined currently in the absence of IV contrast. Largest lesion in the subcapsular right lobe measures 9 mm and is consistent with simple cyst. Decompressed gallbladder without calcified gallstone. No biliary dilatation. Pancreas: No  ductal dilatation or inflammation. Spleen: Normal in size without focal abnormality. Adrenals/Urinary Tract: No adrenal nodule. There is an obstructing 5 x 5 mm stone in the right proximal ureter (at the level of L4) with mild hydronephrosis and perinephric edema. This is similar in location to stone on prior CT. The more distal ureter is decompressed. No additional nonobstructing calculi in either kidney. Left renal cyst again seen. Urinary bladder is near completely empty. No bladder stone. Stomach/Bowel: Stomach is within normal limits. Appendix appears normal. No evidence of bowel wall thickening, distention, or inflammatory changes. Vascular/Lymphatic: Normal caliber abdominal aorta. No adenopathy. Reproductive: Prostate is unremarkable. Other: Trace free fluid in the dependent pelvis is likely reactive. There is  no free air. Small fat containing umbilical hernia again seen. Musculoskeletal: There are no acute or suspicious osseous abnormalities. IMPRESSION: Obstructing 5 x 5 mm stone in the right proximal ureter with mild hydronephrosis and perinephric edema. Electronically Signed   By: Narda Rutherford M.D.   On: 07/03/2019 20:37    Procedures Procedures (including critical care time)  Medications Ordered in ED Medications  fentaNYL (SUBLIMAZE) injection 50 mcg (50 mcg Nasal Given 07/03/19 1644)  ondansetron (ZOFRAN-ODT) disintegrating tablet 4 mg (4 mg Oral Given 07/03/19 1644)  ondansetron (ZOFRAN) injection 4 mg (4 mg Intravenous Given 07/03/19 1845)  ketorolac (TORADOL) 30 MG/ML injection 30 mg (30 mg Intravenous Given 07/03/19 2040)  HYDROmorphone (DILAUDID) injection 1 mg (1 mg Intravenous Given 07/03/19 2152)    ED Course  I have reviewed the triage vital signs and the nursing notes.  Pertinent labs & imaging results that were available during my care of the patient were reviewed by me and considered in my medical decision making (see chart for details).    MDM Rules/Calculators/A&P                       44 year old male who presents for evaluation of right flank pain that began this morning.  Associate with nausea/vomiting.  Recent history of kidney stones in March 2021.  States that this felt similar.  No dysuria, hematuria.  No fevers.  He went to urgent care and was evaluated and was told to come to the emergency department for further evaluation.  On initial ED arrival, he was afebrile, nontoxic-appearing but did appear uncomfortable.  Vital signs are stable.  On exam, he had pain noted to the right flank as well as the right lower abdomen.  Concern for kidney stone given acute onset of symptoms.  Lower suspicion for appendicitis but also consideration.  We will plan to check labs, CT scan.  CMP shows normal BUN and creatinine.  UA shows moderate hemoglobin.  No evidence of infectious etiology.  CBC with no leukocytosis or anemia.  CT scan shows obstructing 5 x 5 mm stone in the right proximal ureter with mild hydronephrosis and perinephric edema.  No other acute abnormalities.  Reevaluation.  Patient still having some pain.  He states it is improved but he still has some pain noted on exam.  Will give additional analgesics.  Reevaluation after second analgesics.  Patient is resting comfortably.  He reports improvement in pain and states he is ready to go home.  He has been able to tolerate p.o. without any signs of vomiting.  He is hemodynamically stable here in the emergency department.  We will plan to give short course of pain medication for acute pain.  Patient instructed to follow-up with urology. At this time, patient exhibits no emergent life-threatening condition that require further evaluation in ED or admission. Patient had ample opportunity for questions and discussion. All patient's questions were answered with full understanding. Strict return precautions discussed. Patient expresses understanding and agreement to plan.   Portions of this note were generated with  Scientist, clinical (histocompatibility and immunogenetics). Dictation errors may occur despite best attempts at proofreading.  Final Clinical Impression(s) / ED Diagnoses Final diagnoses:  Kidney stone    Rx / DC Orders ED Discharge Orders         Ordered    ondansetron (ZOFRAN ODT) 4 MG disintegrating tablet  Every 8 hours PRN     07/03/19 2228    HYDROcodone-acetaminophen (NORCO/VICODIN) 5-325 MG tablet  Every 4 hours PRN     07/03/19 2228           Rosana Hoes 07/03/19 2251    Vanetta Mulders, MD 07/04/19 1537

## 2019-07-03 NOTE — ED Notes (Signed)
Written instructions provided by Orlene Erm

## 2019-07-03 NOTE — ED Notes (Signed)
Ivy Lynn daughter [phone] looking for an update on pt

## 2019-07-03 NOTE — ED Notes (Signed)
Pt In 10/10 pain diaphoretic in the lobby. Pt helped into a recliner and placed in position of comfort. Charge aware pt needs rooms asap.

## 2019-07-03 NOTE — ED Notes (Signed)
Patient did return for medication administration, left department saying he was going to ED now

## 2019-07-03 NOTE — ED Triage Notes (Signed)
Patient complains of right sided groin/flank pain, had urine at ucc and sent to ED for further evaluation. Patient had stone recently and thinks same pain

## 2019-07-03 NOTE — ED Notes (Signed)
Pt given cup of gingerale °

## 2019-07-03 NOTE — ED Notes (Signed)
Pt transported to CT ?

## 2019-07-03 NOTE — ED Notes (Signed)
Clinical discharged patient prior to medication administration.  Notified mani, pa, called patient.  He will return for medication administration

## 2019-07-04 ENCOUNTER — Telehealth: Payer: Self-pay | Admitting: *Deleted

## 2019-07-04 NOTE — Telephone Encounter (Signed)
Pt called regarding not having Flomax Rx sent to pharmacy.  RNCM reviewed chart and did not find that Flomax Rx was written.  Advised pt that EDP is not in ED today to write Rx and he will need to be evaluated for Rx.  Pt upset and hung up.

## 2019-07-04 NOTE — Telephone Encounter (Signed)
Camellia J. Lucretia Roers, RN, BSN, [place] [phone]  RNCM set up appointment with Thad Ranger on 6/30 @140 .  Spoke with pt at via telephone and advised to please arrive 15 min early (Address: [address] [place], [place] Phone: [phone]) and take a picture ID and your current medications.  Pt verbalizes understanding of keeping appointment.

## 2019-07-05 ENCOUNTER — Ambulatory Visit (HOSPITAL_COMMUNITY)
Admission: EM | Admit: 2019-07-05 | Discharge: 2019-07-05 | Disposition: A | Discharge status: Home / Self Care | Payer: Self-pay | Attending: Internal Medicine | Admitting: Internal Medicine

## 2019-07-05 ENCOUNTER — Other Ambulatory Visit: Payer: Self-pay

## 2019-07-05 ENCOUNTER — Encounter (HOSPITAL_COMMUNITY): Payer: Self-pay

## 2019-07-05 DIAGNOSIS — N200 Calculus of kidney: Secondary | ICD-10-CM

## 2019-07-05 MED ORDER — ONDANSETRON 4 MG PO TBDP: 4.0000 mg | ORAL_TABLET | Freq: Three times a day (TID) | ORAL | 0 refills | Status: DC | PRN

## 2019-07-05 MED ORDER — KETOROLAC TROMETHAMINE 30 MG/ML IJ SOLN
30.0000 mg | Freq: Once | INTRAMUSCULAR | Status: AC
  Administered 2019-07-05: 30 mg via INTRAMUSCULAR

## 2019-07-05 MED ORDER — KETOROLAC TROMETHAMINE 30 MG/ML IJ SOLN
INTRAMUSCULAR | Status: AC
  Filled 2019-07-05: qty 1

## 2019-07-05 MED ORDER — TAMSULOSIN HCL 0.4 MG PO CAPS: 0.4000 mg | ORAL_CAPSULE | Freq: Every day | ORAL | 0 refills | Status: DC

## 2019-07-05 NOTE — ED Triage Notes (Signed)
Pt c/o 10/10 "knotty" pain in RLQ of abdomenx1 mo. PT states he was told he has a 33mm kidney stone in right kidney, but wasn't given meds to help pass the stone per pt. Per pt, he was told that he would pass the stone.

## 2019-07-05 NOTE — ED Provider Notes (Signed)
[facility]    CSN: [id number] Arrival date & time: 07/05/19  1041      History   Chief Complaint Chief Complaint  Patient presents with  . Nephrolithiasis    HPI Velton Roselle is a 44 y.o. male with a history of nephrolithiasis comes to urgent care with continued right flank pain.  Patient was seen in the emergency department and diagnosed with 5 mm obstructing kidney stone in the right proximal ureter with perinephric stranding.  Patient complains of some nausea and vomiting this morning.  He is afebrile.  Urinalysis from yesterday was significant for hemoglobin and ketones.  No leukocyte esterase or nitrites.  Patient was prescribed Zofran and some narcotics.Marland Kitchen   HPI  Past Medical History:  Diagnosis Date  . Kidney stones     There are no problems to display for this patient.   Past Surgical History:  Procedure Laterality Date  . DENTAL SURGERY    . TONSILLECTOMY         Home Medications    Prior to Admission medications   Medication Sig Start Date End Date Taking? Authorizing Provider  HYDROcodone-acetaminophen (NORCO/VICODIN) 5-325 MG tablet Take 2 tablets by mouth every 4 (four) hours as needed. 07/03/19   Volanda Napoleon, PA-C  ibuprofen (ADVIL) 600 MG tablet Take 1 tablet (600 mg total) by mouth every 6 (six) hours as needed. 06/10/19   Darr, Marguerita Beards, PA-C  ondansetron (ZOFRAN ODT) 4 MG disintegrating tablet Take 1 tablet (4 mg total) by mouth every 8 (eight) hours as needed for nausea or vomiting. 07/05/19   Sahana Boyland, Myrene Galas, MD  tamsulosin (FLOMAX) 0.4 MG CAPS capsule Take 1 capsule (0.4 mg total) by mouth daily after breakfast. 07/05/19   Shakirra Buehler, Myrene Galas, MD    Family History Family History  Problem Relation Age of Onset  . Hypertension Mother     Social History Social History   Tobacco Use  . Smoking status: Current Some Day Smoker    Packs/day: 1.00    Years: 10.00    Pack years: 10.00    Types: Cigarettes  . Smokeless tobacco:  Never Used  Substance Use Topics  . Alcohol use: No  . Drug use: Yes    Types: Marijuana     Allergies   Patient has no known allergies.   Review of Systems Review of Systems  Constitutional: Positive for activity change. Negative for chills, fatigue and fever.  HENT: Negative.   Respiratory: Negative.   Cardiovascular: Negative.   Gastrointestinal: Positive for abdominal pain, nausea and vomiting. Negative for diarrhea.  Genitourinary: Positive for flank pain. Negative for dysuria, frequency and urgency.  Neurological: Negative for dizziness, light-headedness and headaches.     Physical Exam Triage Vital Signs ED Triage Vitals  Enc Vitals Group     BP 07/05/19 1059 (!) 129/92     Pulse Rate 07/05/19 1059 62     Resp 07/05/19 1059 18     Temp 07/05/19 1059 98.7 F (37.1 C)     Temp Source 07/05/19 1059 Oral     SpO2 07/05/19 1059 99 %     Weight 07/05/19 1100 177 lb (80.3 kg)     Height 07/05/19 1100 6' (1.829 m)     Head Circumference --      Peak Flow --      Pain Score 07/05/19 1100 10     Pain Loc --      Pain Edu? --  Excl. in GC? --    No data found.  Updated Vital Signs BP (!) 129/92   Pulse 62   Temp 98.7 F (37.1 C) (Oral)   Resp 18   Ht 6' (1.829 m)   Wt 80.3 kg   SpO2 99%   BMI 24.01 kg/m   Visual Acuity Right Eye Distance:   Left Eye Distance:   Bilateral Distance:    Right Eye Near:   Left Eye Near:    Bilateral Near:     Physical Exam   UC Treatments / Results  Labs (all labs ordered are listed, but only abnormal results are displayed) Labs Reviewed - No data to display  EKG   Radiology CT Renal Stone Study  Result Date: 07/03/2019 CLINICAL DATA:  Right-sided abdominal pain. Intermittent vomiting. Patient reports recently passed kidney stone. EXAM: CT ABDOMEN AND PELVIS WITHOUT CONTRAST TECHNIQUE: Multidetector CT imaging of the abdomen and pelvis was performed following the standard protocol without IV contrast.  COMPARISON:  Abdominal CT 04/14/2019 FINDINGS: Lower chest: Lung bases are clear. Heart is normal in size. Hepatobiliary: Scattered hepatic hypodensities, less well-defined currently in the absence of IV contrast. Largest lesion in the subcapsular right lobe measures 9 mm and is consistent with simple cyst. Decompressed gallbladder without calcified gallstone. No biliary dilatation. Pancreas: No ductal dilatation or inflammation. Spleen: Normal in size without focal abnormality. Adrenals/Urinary Tract: No adrenal nodule. There is an obstructing 5 x 5 mm stone in the right proximal ureter (at the level of L4) with mild hydronephrosis and perinephric edema. This is similar in location to stone on prior CT. The more distal ureter is decompressed. No additional nonobstructing calculi in either kidney. Left renal cyst again seen. Urinary bladder is near completely empty. No bladder stone. Stomach/Bowel: Stomach is within normal limits. Appendix appears normal. No evidence of bowel wall thickening, distention, or inflammatory changes. Vascular/Lymphatic: Normal caliber abdominal aorta. No adenopathy. Reproductive: Prostate is unremarkable. Other: Trace free fluid in the dependent pelvis is likely reactive. There is no free air. Small fat containing umbilical hernia again seen. Musculoskeletal: There are no acute or suspicious osseous abnormalities. IMPRESSION: Obstructing 5 x 5 mm stone in the right proximal ureter with mild hydronephrosis and perinephric edema. Electronically Signed   By: Narda Rutherford M.D.   On: 07/03/2019 20:37    Procedures Procedures (including critical care time)  Medications Ordered in UC Medications  ketorolac (TORADOL) 30 MG/ML injection 30 mg (30 mg Intramuscular Given 07/05/19 1124)    Initial Impression / Assessment and Plan / UC Course  I have reviewed the triage vital signs and the nursing notes.  Pertinent labs & imaging results that were available during my care of the  patient were reviewed by me and considered in my medical decision making (see chart for details).     1.  Ureterolithiasis with renal colic: Tamsulosin 0.4 mg daily for 10 days Patient can discontinue if the pain improves. If pain is persistent patient will need urology follow-up. Urology information has been given to the patient.  He is encouraged to call the if his symptoms does not improve in the next couple to a few days. Patient is encouraged to continue oral fluid intake. Zofran prescription has been refilled Continue current pain medication regimen Return precautions given. Final Clinical Impressions(s) / UC Diagnoses   Final diagnoses:  Nephrolithiasis   Discharge Instructions   None    ED Prescriptions    Medication Sig Dispense Auth. Provider   tamsulosin (  FLOMAX) 0.4 MG CAPS capsule Take 1 capsule (0.4 mg total) by mouth daily after breakfast. 10 capsule Jonthan Leite, Britta Mccreedy, MD   ondansetron (ZOFRAN ODT) 4 MG disintegrating tablet Take 1 tablet (4 mg total) by mouth every 8 (eight) hours as needed for nausea or vomiting. 10 tablet Custer Pimenta, Britta Mccreedy, MD     PDMP not reviewed this encounter.   Merrilee Jansky, MD 07/05/19 1152

## 2019-07-11 ENCOUNTER — Encounter: Payer: Self-pay | Admitting: Nurse Practitioner

## 2019-07-11 ENCOUNTER — Other Ambulatory Visit: Payer: Self-pay

## 2019-07-11 ENCOUNTER — Ambulatory Visit (INDEPENDENT_AMBULATORY_CARE_PROVIDER_SITE_OTHER): Payer: Self-pay | Admitting: Nurse Practitioner

## 2019-07-11 VITALS — BP 117/80 | HR 87 | Temp 98.7°F | Wt 161.2 lb

## 2019-07-11 DIAGNOSIS — Z0000 Encounter for general adult medical examination without abnormal findings: Secondary | ICD-10-CM

## 2019-07-11 DIAGNOSIS — Z23 Encounter for immunization: Secondary | ICD-10-CM

## 2019-07-11 DIAGNOSIS — Z87442 Personal history of urinary calculi: Secondary | ICD-10-CM

## 2019-07-11 LAB — POCT URINALYSIS DIPSTICK
Bilirubin, UA: NEGATIVE
Blood, UA: NEGATIVE
Glucose, UA: NEGATIVE
Ketones, UA: NEGATIVE
Nitrite, UA: NEGATIVE
Protein, UA: NEGATIVE
Spec Grav, UA: 1.02 (ref 1.010–1.025)
Urobilinogen, UA: 0.2 E.U./dL
pH, UA: 6.5 (ref 5.0–8.0)

## 2019-07-11 NOTE — Progress Notes (Signed)
[address] [place], [place]  [address] Phone:  [phone]   Fax:  [phone]   New Patient Office Visit  Subjective:  Patient ID: Bryce Baxter, male    DOB: 17-Jul-1975  Age: 44 y.o. MRN: [id number]  CC:  Chief Complaint  Patient presents with  . New Patient (Initial Visit)    est care follow up kidney stone , has appt with urology today     HPI Bryce Baxter presents to establish care. He  has a past medical history of Kidney stones and Kidney stones (06/2019).  Hematuria Patient complains of microscopic hematuria. Onset of hematuria was several weeks ago and was sudden in onset. There is a history of nephrolithiasis. There is not a history of urologic trauma. Other urologic symptoms include sense of residual urine. Patient admits to history of no risk factors for cancer. Patient denies history of chronic Foley catheter, GU surgeries, occupational exposure, sexually transmitted diseases and trauma. Prior workup has been Labs (BMP or CMP), UA'S, CT. He has an apt with urology today. He admits that he is making lifestyle changes. He does wonder which food and beverages that he should avoid.   Denies headache, dizziness, visual changes, shortness of breath, dyspnea on exertion, chest pain, nausea, vomiting or any edema. He admits that he has not been having complete emptying with his bowel movements.    Past Medical History:  Diagnosis Date  . Kidney stones   . Kidney stones 06/2019   62mm    Past Surgical History:  Procedure Laterality Date  . DENTAL SURGERY    . TONSILLECTOMY      Family History  Problem Relation Age of Onset  . Hypertension Mother     Social History   Socioeconomic History  . Marital status: Single    Spouse name: Not on file  . Number of children: Not on file  . Years of education: Not on file  . Highest education level: Not on file  Occupational History  . Not on file  Tobacco Use  . Smoking status:  Current Some Day Smoker    Packs/day: 1.00    Years: 10.00    Pack years: 10.00    Types: Cigarettes  . Smokeless tobacco: Never Used  Substance and Sexual Activity  . Alcohol use: No  . Drug use: Yes    Types: Marijuana    Comment: rare  . Sexual activity: Yes    Birth control/protection: Condom  Other Topics Concern  . Not on file  Social History Narrative  . Not on file   Social Determinants of Health   Financial Resource Strain:   . Difficulty of Paying Living Expenses:   Food Insecurity:   . Worried About Charity fundraiser in the Last Year:   . Arboriculturist in the Last Year:   Transportation Needs:   . Film/video editor (Medical):   Marland Kitchen Lack of Transportation (Non-Medical):   Physical Activity:   . Days of Exercise per Week:   . Minutes of Exercise per Session:   Stress:   . Feeling of Stress :   Social Connections:   . Frequency of Communication with Friends and Family:   . Frequency of Social Gatherings with Friends and Family:   . Attends Religious Services:   . Active Member of Clubs or Organizations:   . Attends Archivist Meetings:   Marland Kitchen Marital Status:   Intimate Partner Violence:   .  Fear of Current or Ex-Partner:   . Emotionally Abused:   Marland Kitchen Physically Abused:   . Sexually Abused:     ROS Review of Systems  All other systems reviewed and are negative.   Objective:   Today's Vitals: BP 117/80 (BP Location: Left Arm, Patient Position: Sitting, Cuff Size: Normal)   Pulse 87   Temp 98.7 F (37.1 C) (Temporal)   Wt 161 lb 3.2 oz (73.1 kg)   SpO2 100%   BMI 21.86 kg/m   Physical Exam Vitals reviewed.  Constitutional:      General: He is not in acute distress.    Appearance: Normal appearance. He is normal weight. He is not ill-appearing, toxic-appearing or diaphoretic.  HENT:     Head: Normocephalic and atraumatic.     Nose: Nose normal.     Mouth/Throat:     Mouth: Mucous membranes are moist.     Pharynx: Oropharynx is  clear.  Eyes:     Pupils: Pupils are equal, round, and reactive to light.  Cardiovascular:     Rate and Rhythm: Normal rate and regular rhythm.     Pulses: Normal pulses.     Heart sounds: Normal heart sounds.  Pulmonary:     Effort: Pulmonary effort is normal.     Breath sounds: Normal breath sounds.  Abdominal:     General: Abdomen is flat. Bowel sounds are normal.     Tenderness: There is no right CVA tenderness or left CVA tenderness.  Musculoskeletal:        General: Normal range of motion.     Cervical back: Normal range of motion.  Skin:    General: Skin is warm and dry.     Capillary Refill: Capillary refill takes less than 2 seconds.  Neurological:     General: No focal deficit present.     Mental Status: He is alert and oriented to person, place, and time.  Psychiatric:        Mood and Affect: Mood normal.        Behavior: Behavior normal.        Thought Content: Thought content normal.        Judgment: Judgment normal.     Assessment & Plan:   Problem List Items Addressed This Visit    None    Visit Diagnoses    History of kidney stones    -  Primary   Relevant Orders   POCT Urinalysis Dipstick (Completed)   Need for Tdap vaccination       Healthcare maintenance       Relevant Orders   PSA (Completed)   Vitamin B12 (Completed)   Magnesium (Completed)      Outpatient Encounter Medications as of 07/11/2019  Medication Sig  . ibuprofen (ADVIL) 600 MG tablet Take 1 tablet (600 mg total) by mouth every 6 (six) hours as needed.  . tamsulosin (FLOMAX) 0.4 MG CAPS capsule Take 1 capsule (0.4 mg total) by mouth daily after breakfast.  . HYDROcodone-acetaminophen (NORCO/VICODIN) 5-325 MG tablet Take 2 tablets by mouth every 4 (four) hours as needed. (Patient not taking: Reported on 07/11/2019)  . ondansetron (ZOFRAN ODT) 4 MG disintegrating tablet Take 1 tablet (4 mg total) by mouth every 8 (eight) hours as needed for nausea or vomiting. (Patient not taking: Reported  on 07/11/2019)   No facility-administered encounter medications on file as of 07/11/2019.    Follow-up: Return for follow up as needed.   Barbette Merino, NP

## 2019-07-11 NOTE — Patient Instructions (Signed)

## 2019-07-12 LAB — VITAMIN B12: Vitamin B-12: 465 pg/mL (ref 232–1245)

## 2019-07-12 LAB — MAGNESIUM: Magnesium: 2.3 mg/dL (ref 1.6–2.3)

## 2019-07-12 LAB — PSA: Prostate Specific Ag, Serum: 1 ng/mL (ref 0.0–4.0)

## 2020-07-13 DIAGNOSIS — Z720 Tobacco use: Secondary | ICD-10-CM | POA: Insufficient documentation

## 2020-07-13 DIAGNOSIS — R195 Other fecal abnormalities: Secondary | ICD-10-CM | POA: Insufficient documentation

## 2020-07-13 DIAGNOSIS — K08109 Complete loss of teeth, unspecified cause, unspecified class: Secondary | ICD-10-CM | POA: Insufficient documentation

## 2020-08-22 IMAGING — CT CT RENAL STONE PROTOCOL
2 of 4 series · 16 of 46 positions shown, 18 images · non-contrast
Comparison: Abdominal CT 04/14/2019

CLINICAL DATA: Right-sided abdominal pain. Intermittent vomiting.
Patient reports recently passed kidney stone.

EXAM:
CT ABDOMEN AND PELVIS WITHOUT CONTRAST
TECHNIQUE: Multidetector CT imaging of the abdomen and pelvis was performed
following the standard protocol without IV contrast.

[Series 2: stone study 5.0 i30f 2 · axial · 0.70mm/px · z∈[+687,+1117]mm · 13 of 94 slices shown, 15 images]
[im 4/94  soft-tissue]
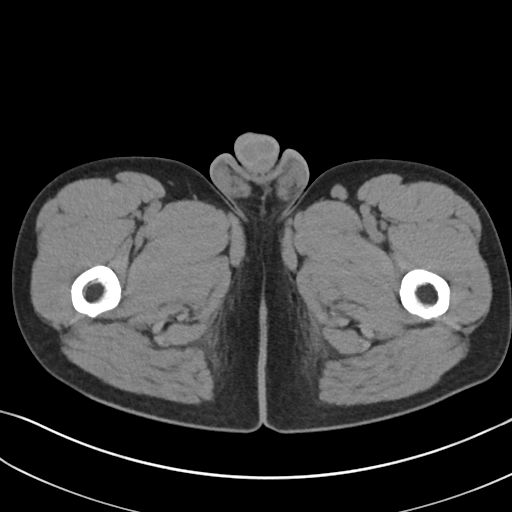
[im 4/94  bone]
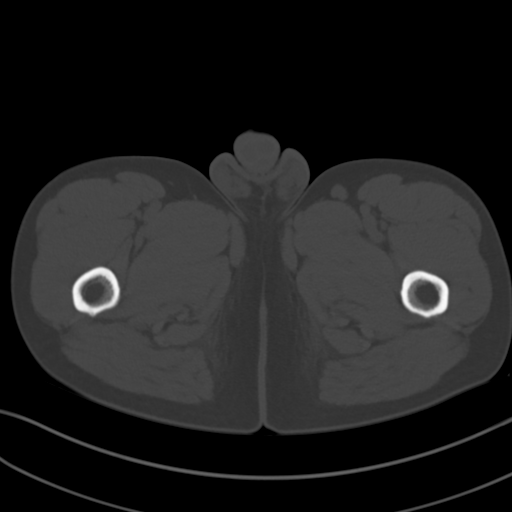
[im 12/94  soft-tissue]
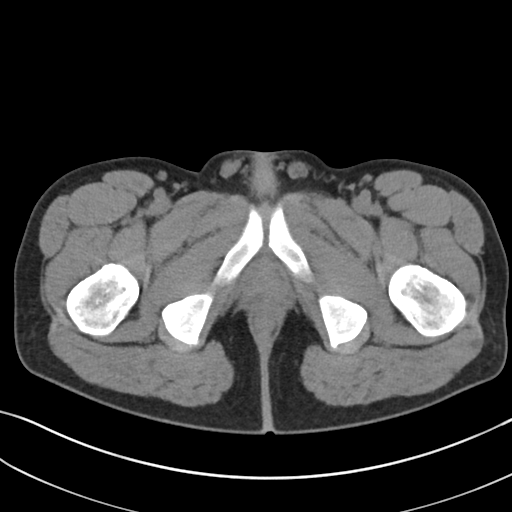
[im 20/94  soft-tissue]
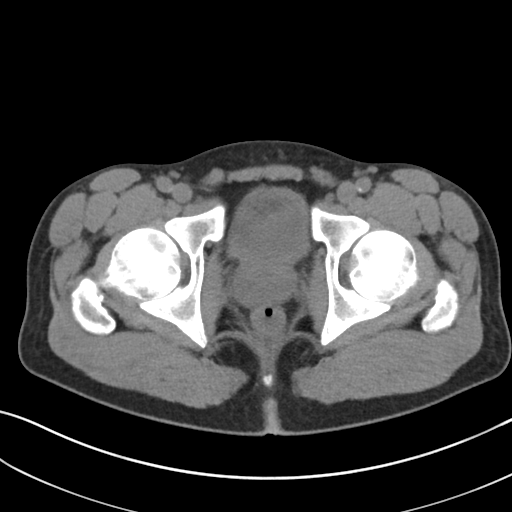
[im 28/94  soft-tissue]
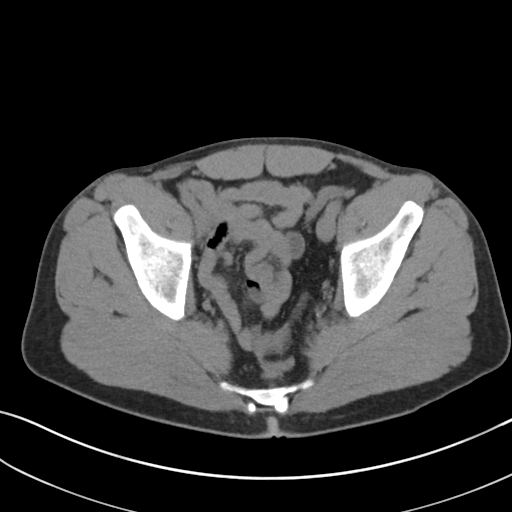
[im 32/94  soft-tissue]
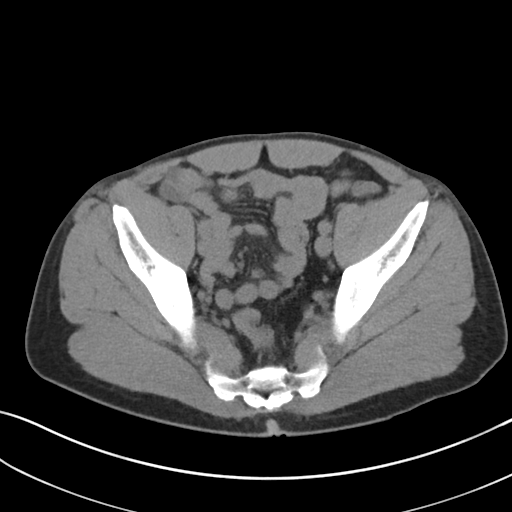
[im 39/94  soft-tissue]
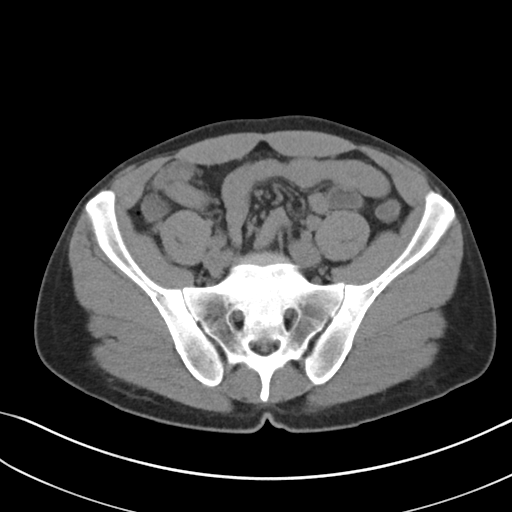
[im 47/94  soft-tissue]
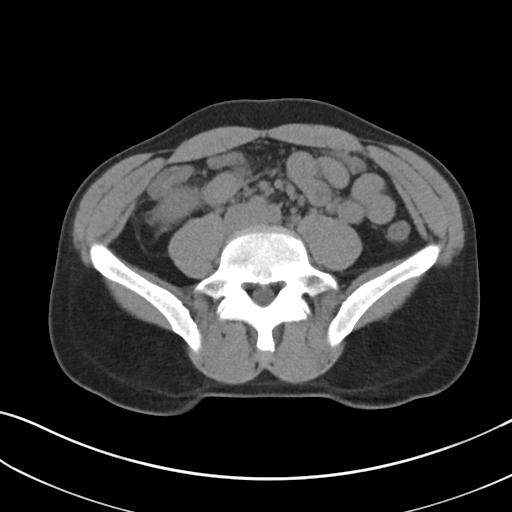
[im 55/94  soft-tissue]
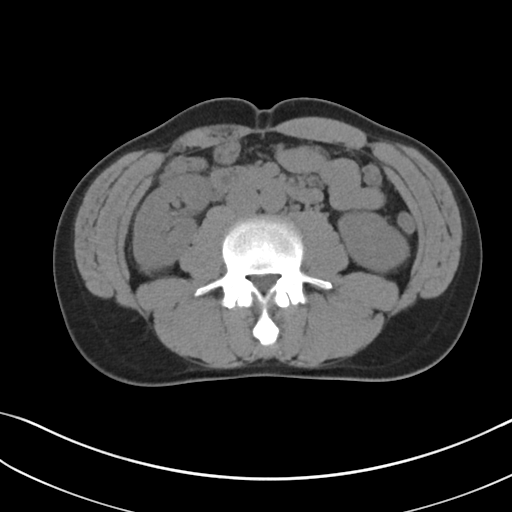
[im 63/94  soft-tissue]
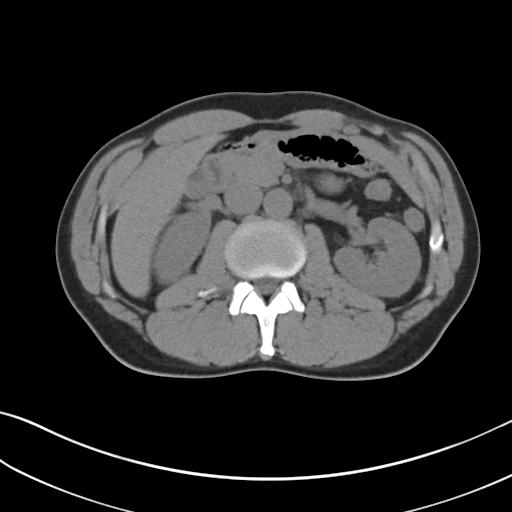
[im 63/94  bone]
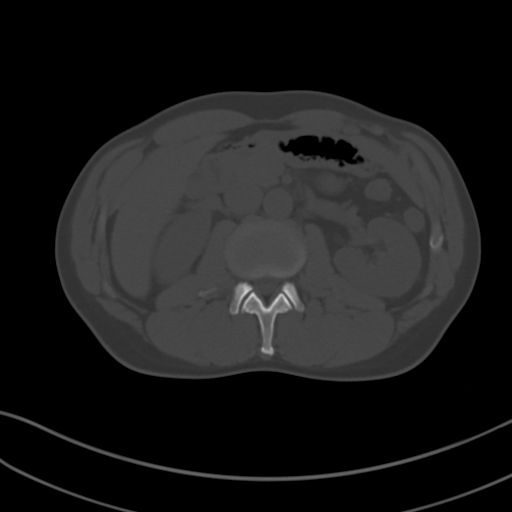
[im 66/94  soft-tissue]
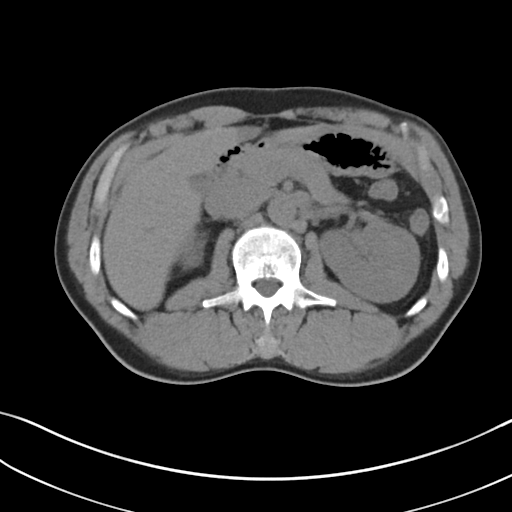
[im 74/94  soft-tissue]
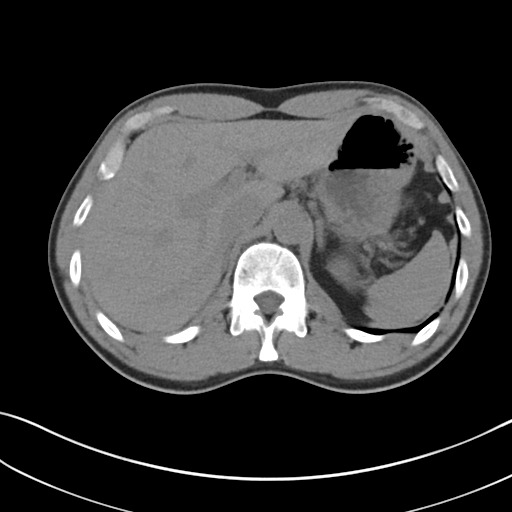
[im 82/94  soft-tissue]
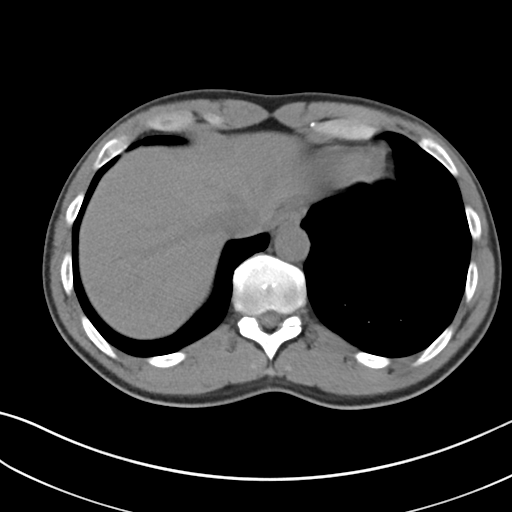
[im 90/94  soft-tissue]
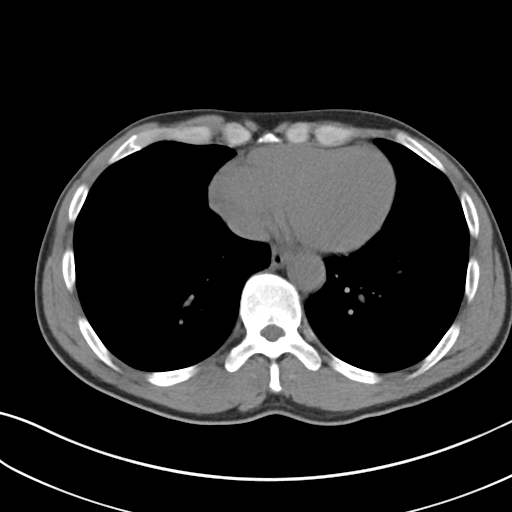

[Series 5: coronal soft tissue · coronal · 0.74mm/px · 3 of 101 slices shown]
[im 34/101  soft-tissue]
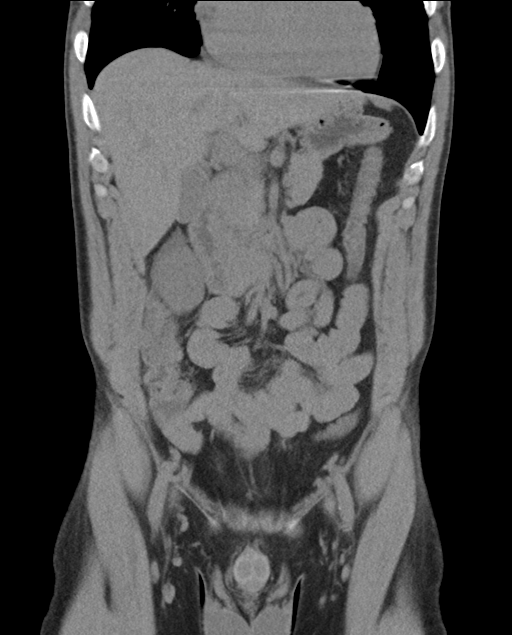
[im 45/101  soft-tissue]
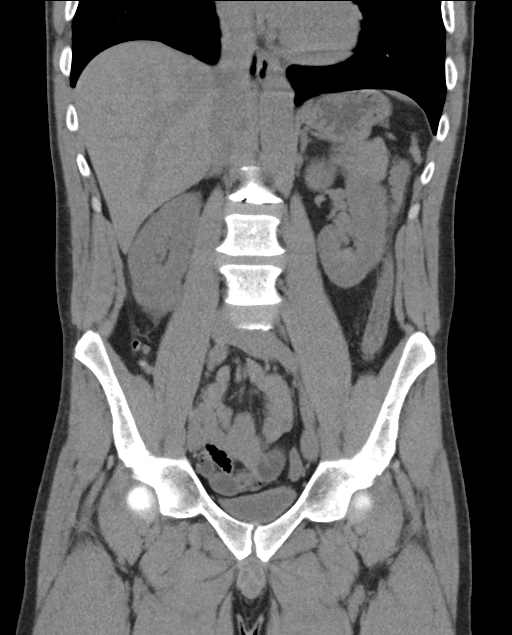
[im 56/101  soft-tissue]
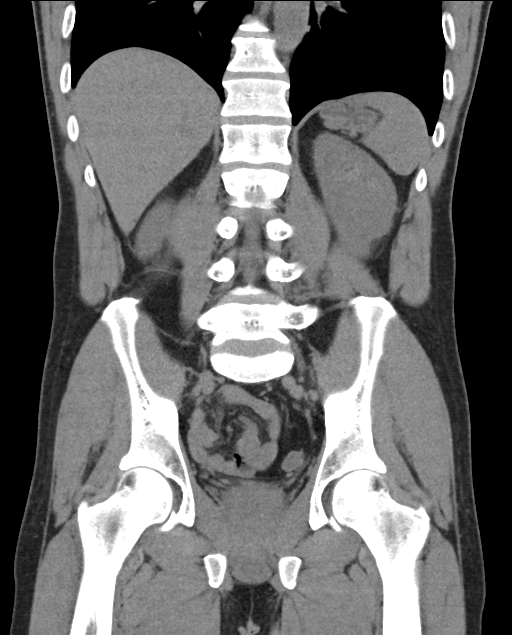

[16 of 46 positions shown; findings below may reference images not displayed]

FINDINGS: Lower chest: Lung bases are clear. Heart is normal in size.

Hepatobiliary: Scattered hepatic hypodensities, less well-defined
currently in the absence of IV contrast. Largest lesion in the
subcapsular right lobe measures 9 mm and is consistent with simple
cyst. Decompressed gallbladder without calcified gallstone. No
biliary dilatation.

Pancreas: No ductal dilatation or inflammation.

Spleen: Normal in size without focal abnormality.

Adrenals/Urinary Tract: No adrenal nodule. There is an obstructing 5
x 5 mm stone in the right proximal ureter (at the level of L4) with
mild hydronephrosis and perinephric edema. This is similar in
location to stone on prior CT. The more distal ureter is
decompressed. No additional nonobstructing calculi in either kidney.
Left renal cyst again seen. Urinary bladder is near completely
empty. No bladder stone.

Stomach/Bowel: Stomach is within normal limits. Appendix appears
normal. No evidence of bowel wall thickening, distention, or
inflammatory changes.

Vascular/Lymphatic: Normal caliber abdominal aorta. No adenopathy.

Reproductive: Prostate is unremarkable.

Other: Trace free fluid in the dependent pelvis is likely reactive.
There is no free air. Small fat containing umbilical hernia again
seen.

Musculoskeletal: There are no acute or suspicious osseous
abnormalities.
IMPRESSION: Obstructing 5 x 5 mm stone in the right proximal ureter with mild
hydronephrosis and perinephric edema.

## 2021-04-10 ENCOUNTER — Ambulatory Visit (HOSPITAL_COMMUNITY)
Admission: EM | Admit: 2021-04-10 | Discharge: 2021-04-10 | Disposition: A | Discharge status: Home / Self Care | Payer: Self-pay | Attending: Urgent Care | Admitting: Urgent Care

## 2021-04-10 ENCOUNTER — Other Ambulatory Visit: Payer: Self-pay

## 2021-04-10 ENCOUNTER — Encounter (HOSPITAL_COMMUNITY): Payer: Self-pay

## 2021-04-10 DIAGNOSIS — L259 Unspecified contact dermatitis, unspecified cause: Secondary | ICD-10-CM

## 2021-04-10 MED ORDER — EYE WASH OPHTH SOLN
OPHTHALMIC | Status: AC
  Filled 2021-04-10: qty 118

## 2021-04-10 MED ORDER — HYDROXYZINE HCL 25 MG PO TABS: 12.5000 mg | ORAL_TABLET | Freq: Three times a day (TID) | ORAL | 0 refills | Status: DC | PRN

## 2021-04-10 MED ORDER — OLOPATADINE HCL 0.1 % OP SOLN: 1.0000 [drp] | Freq: Two times a day (BID) | OPHTHALMIC | 0 refills | Status: DC
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
## 2021-11-16 NOTE — Congregational Nurse Program (Signed)
  Dept: [phone]   Congregational Nurse Program Note  Date of Encounter: 11/16/2021  Clinic visit to arrange for dental care, has 6 front teeth with swollen gums.  States other teeth were extracted prior to the pandemic but no further dental care since that time.  Also, to have assistance with Medicaid application.  Past Medical History: Past Medical History:  Diagnosis Date   Kidney stones    Kidney stones 06/2019   38mm    Encounter Details:  CNP Questionnaire - 11/16/21 0953       Questionnaire   Ask client: Do you give verbal consent for me to treat you today? Yes    Student Assistance N/A    Location Patient [facility]    Visit Setting with Client Organization    Patient Status Unhoused    Insurance Uninsured ([employer] Connects/Self-Pay/Medicaid Family Planning)    Insurance/Financial Assistance Referral Medicaid    Medication N/A    Medical Provider No    Screening Referrals Made N/A    Medical Referrals Made [facility]    Medical Appointment Made N/A    Recently w/o PCP, now 1st time PCP visit completed due to CNs referral or appointment made N/A    Food N/A    Transportation N/A    Housing/Utilities No permanent housing    Interpersonal Safety N/A    Interventions Advocate/Support;Counsel    Abnormal to Normal Screening Since Last CN Visit N/A    Sent Client to Lab for: N/A    Did client attend any of the following based off CNs referral or appointments made? N/A    ED Visit Averted N/A    Life-Saving Intervention Made N/A

## 2021-11-17 ENCOUNTER — Other Ambulatory Visit (HOSPITAL_COMMUNITY): Payer: Self-pay

## 2021-11-17 ENCOUNTER — Encounter: Payer: Self-pay | Admitting: Critical Care Medicine

## 2021-11-17 ENCOUNTER — Other Ambulatory Visit: Payer: Self-pay | Admitting: Critical Care Medicine

## 2021-11-17 ENCOUNTER — Encounter: Payer: Self-pay | Admitting: Physician Assistant

## 2021-11-17 MED ORDER — AMOXICILLIN-POT CLAVULANATE 875-125 MG PO TABS
1.0000 | ORAL_TABLET | Freq: Two times a day (BID) | ORAL | 0 refills | Status: DC
  Filled 2021-11-17: qty 20, 10d supply, fill #0
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
## 2021-12-22 ENCOUNTER — Encounter: Payer: Self-pay | Admitting: Physician Assistant

## 2021-12-22 NOTE — Progress Notes (Signed)
Pt seen by Dr Delford Field.  The rest of his dental work, the dentures will be about $2142. He cannot do it for free, but may be able to set up a payment plan. He has had 2 teeth extracted, this was done at a discount, the grant covered the rest.   He is really ready to get this started.   He needs a PCP, will f/u with PW.   In his mouth, The infection has improved, he has 4 teeth left. He is willing to do a payment plan to get the dentures.   Per the email, total amt is 3300, other 4 teeth 682, dentures 2142.   Theodore Demark, PA-C 12/22/2021 3:05 PM

## 2022-01-26 ENCOUNTER — Other Ambulatory Visit (HOSPITAL_COMMUNITY): Payer: Self-pay

## 2022-01-26 ENCOUNTER — Other Ambulatory Visit: Payer: Self-pay | Admitting: Critical Care Medicine

## 2022-01-26 ENCOUNTER — Encounter: Payer: Self-pay | Admitting: Physician Assistant

## 2022-01-26 MED ORDER — CHLORHEXIDINE GLUCONATE 0.12 % MT SOLN
15.0000 mL | Freq: Two times a day (BID) | OROMUCOSAL | 0 refills | Status: DC
  Filled 2022-01-26: qty 473, 16d supply, fill #0

## 2022-01-26 NOTE — Progress Notes (Signed)
Pt seen by Dr Delford Field  He had dental work, very good experience, has as few ABX left.   No sig swelling or bleeding from the site.   He eats mashed potatoes and gravy or a biscuit. Was given 8 Ensure, try to have one 1-2 x day.   Healing well, working 2 jobs. Trying to find a place.   He was at a rooming house but they got bedbugs and he had to leave. That is when he became homeless  Helped [place] out at [facility].  Today's Vitals   01/26/22 1544  BP: 135/82  Pulse: 66   There is no height or weight on file to calculate BMI.  Theodore Demark, PA-C 01/26/2022 3:59 PM

## 2022-01-27 ENCOUNTER — Other Ambulatory Visit (HOSPITAL_COMMUNITY): Payer: Self-pay

## 2022-03-06 NOTE — Progress Notes (Deleted)
   New Patient Office Visit  Subjective    Patient ID: Bryce Baxter, male    DOB: 11/16/1975  Age: 47 y.o. MRN: [id number]  CC: No chief complaint on file.   HPI Bryce Baxter presents to establish care ***  Outpatient Encounter Medications as of 03/08/2022  Medication Sig   chlorhexidine (PERIDEX) 0.12 % solution Use as directed 15 mLs in the mouth or throat 2 (two) times daily.   ibuprofen (ADVIL) 600 MG tablet Take 1 tablet (600 mg total) by mouth every 6 (six) hours as needed.   olopatadine (PATANOL) 0.1 % ophthalmic solution Place 1 drop into both eyes 2 (two) times daily.   No facility-administered encounter medications on file as of 03/08/2022.    Past Medical History:  Diagnosis Date   Kidney stones    Kidney stones 06/2019   72mm    Past Surgical History:  Procedure Laterality Date   DENTAL SURGERY     TONSILLECTOMY      Family History  Problem Relation Age of Onset   Hypertension Mother     Social History   Socioeconomic History   Marital status: Single    Spouse name: Not on file   Number of children: Not on file   Years of education: Not on file   Highest education level: Not on file  Occupational History   Not on file  Tobacco Use   Smoking status: Some Days    Packs/day: 1.00    Years: 10.00    Total pack years: 10.00    Types: Cigarettes   Smokeless tobacco: Never  Substance and Sexual Activity   Alcohol use: No   Drug use: Yes    Types: Marijuana    Comment: rare   Sexual activity: Yes    Birth control/protection: Condom  Other Topics Concern   Not on file  Social History Narrative   Not on file   Social Determinants of Health   Financial Resource Strain: Not on file  Food Insecurity: Not on file  Transportation Needs: Not on file  Physical Activity: Not on file  Stress: Not on file  Social Connections: Not on file  Intimate Partner Violence: Not on file    ROS      Objective    There were no vitals taken for this  visit.  Physical Exam  {Labs (Optional):23779}    Assessment & Plan:   Problem List Items Addressed This Visit   None   No follow-ups on file.   Bryce Noble, MD

## 2022-03-08 ENCOUNTER — Ambulatory Visit: Payer: Self-pay

## 2022-03-08 ENCOUNTER — Encounter: Payer: Self-pay | Admitting: Critical Care Medicine

## 2022-03-08 ENCOUNTER — Ambulatory Visit: Payer: Self-pay | Attending: Critical Care Medicine | Admitting: Critical Care Medicine

## 2022-03-08 VITALS — BP 128/78 | HR 66 | Temp 98.2°F | Wt 151.0 lb

## 2022-03-08 DIAGNOSIS — Z1211 Encounter for screening for malignant neoplasm of colon: Secondary | ICD-10-CM

## 2022-03-08 DIAGNOSIS — K08109 Complete loss of teeth, unspecified cause, unspecified class: Secondary | ICD-10-CM

## 2022-03-08 DIAGNOSIS — F17210 Nicotine dependence, cigarettes, uncomplicated: Secondary | ICD-10-CM | POA: Insufficient documentation

## 2022-03-08 DIAGNOSIS — Z1159 Encounter for screening for other viral diseases: Secondary | ICD-10-CM

## 2022-03-08 DIAGNOSIS — Z114 Encounter for screening for human immunodeficiency virus [HIV]: Secondary | ICD-10-CM

## 2022-03-08 DIAGNOSIS — Z0000 Encounter for general adult medical examination without abnormal findings: Secondary | ICD-10-CM | POA: Insufficient documentation

## 2022-03-08 DIAGNOSIS — Z720 Tobacco use: Secondary | ICD-10-CM

## 2022-03-08 DIAGNOSIS — Z139 Encounter for screening, unspecified: Secondary | ICD-10-CM

## 2022-03-08 DIAGNOSIS — R195 Other fecal abnormalities: Secondary | ICD-10-CM

## 2022-03-08 NOTE — Progress Notes (Unsigned)
New Patient Office Visit  Subjective    Patient ID: Bryce Baxter, male    DOB: 1975/12/21  Age: 47 y.o. MRN: [id number]  CC:  Chief Complaint  Patient presents with   New Patient (Initial Visit)    HPI Bryce Baxter presents to establish care This patient was met previously at the [facility].  He currently works in a Charity fundraiser in [employer].  He currently has no symptoms at this time.  He is currently edentulous having had all his teeth removed and planning dentures.  He is trying to get on with the company insurance plan.  He has no complaints at this visit.  Does not drink or smoke.  Past medical history otherwise unremarkable.  No prior history of known hypertension.  Blood pressure initially elevated on arrival but on recheck is improved.  Patient is attempting to get housing.  He is currently smoking about a half a pack of cigarettes a day wishes to quit once he gets his apartment.  Outpatient Encounter Medications as of 03/08/2022  Medication Sig   [DISCONTINUED] chlorhexidine (PERIDEX) 0.12 % solution Use as directed 15 mLs in the mouth or throat 2 (two) times daily.   [DISCONTINUED] ibuprofen (ADVIL) 600 MG tablet Take 1 tablet (600 mg total) by mouth every 6 (six) hours as needed.   [DISCONTINUED] olopatadine (PATANOL) 0.1 % ophthalmic solution Place 1 drop into both eyes 2 (two) times daily.   No facility-administered encounter medications on file as of 03/08/2022.    Past Medical History:  Diagnosis Date   Kidney stones    Kidney stones 06/2019   79mm    Past Surgical History:  Procedure Laterality Date   DENTAL SURGERY     TONSILLECTOMY      Family History  Problem Relation Age of Onset   Hypertension Mother     Social History   Socioeconomic History   Marital status: Single    Spouse name: Not on file   Number of children: Not on file   Years of education: Not on file   Highest education level: Not on file  Occupational History   Not on  file  Tobacco Use   Smoking status: Some Days    Packs/day: 1.00    Years: 10.00    Total pack years: 10.00    Types: Cigarettes   Smokeless tobacco: Never  Substance and Sexual Activity   Alcohol use: No   Drug use: Yes    Types: Marijuana    Comment: rare   Sexual activity: Yes    Birth control/protection: Condom  Other Topics Concern   Not on file  Social History Narrative   Not on file   Social Determinants of Health   Financial Resource Strain: Not on file  Food Insecurity: Not on file  Transportation Needs: Not on file  Physical Activity: Not on file  Stress: Not on file  Social Connections: Not on file  Intimate Partner Violence: Not on file    Review of Systems  Constitutional:  Negative for chills, diaphoresis, fever, malaise/fatigue and weight loss.  HENT:  Negative for congestion, hearing loss, nosebleeds, sore throat and tinnitus.        Dental pain  Eyes:  Negative for blurred vision, photophobia and redness.  Respiratory:  Negative for cough, hemoptysis, sputum production, shortness of breath, wheezing and stridor.   Cardiovascular:  Negative for chest pain, palpitations, orthopnea, claudication, leg swelling and PND.  Gastrointestinal:  Negative for abdominal pain, blood  in stool, constipation, diarrhea, heartburn, nausea and vomiting.  Genitourinary:  Negative for dysuria, flank pain, frequency, hematuria and urgency.  Musculoskeletal:  Negative for back pain, falls, joint pain, myalgias and neck pain.  Skin:  Negative for itching and rash.  Neurological:  Negative for dizziness, tingling, tremors, sensory change, speech change, focal weakness, seizures, loss of consciousness, weakness and headaches.  Endo/Heme/Allergies:  Negative for environmental allergies and polydipsia. Does not bruise/bleed easily.  Psychiatric/Behavioral:  Negative for depression, memory loss, substance abuse and suicidal ideas. The patient is not nervous/anxious and does not have  insomnia.         Objective    BP 128/78   Pulse 66   Temp 98.2 F (36.8 C)   Wt 151 lb (68.5 kg)   SpO2 100%   BMI 20.48 kg/m   Physical Exam Vitals reviewed.  Constitutional:      Appearance: Normal appearance. He is well-developed. He is not diaphoretic.  HENT:     Head: Normocephalic and atraumatic.     Nose: No nasal deformity, septal deviation, mucosal edema or rhinorrhea.     Right Sinus: No maxillary sinus tenderness or frontal sinus tenderness.     Left Sinus: No maxillary sinus tenderness or frontal sinus tenderness.     Mouth/Throat:     Mouth: Mucous membranes are moist.     Pharynx: Oropharynx is clear. No oropharyngeal exudate.     Comments: Edentulous there is some scarring in the anterior gum on the right but no active infection Eyes:     General: No scleral icterus.    Conjunctiva/sclera: Conjunctivae normal.     Pupils: Pupils are equal, round, and reactive to light.  Neck:     Thyroid: No thyromegaly.     Vascular: No carotid bruit or JVD.     Trachea: Trachea normal. No tracheal tenderness or tracheal deviation.  Cardiovascular:     Rate and Rhythm: Normal rate and regular rhythm.     Chest Wall: PMI is not displaced.     Pulses: Normal pulses. No decreased pulses.     Heart sounds: Normal heart sounds, S1 normal and S2 normal. Heart sounds not distant. No murmur heard.    No systolic murmur is present.     No diastolic murmur is present.     No friction rub. No gallop. No S3 or S4 sounds.  Pulmonary:     Effort: No tachypnea, accessory muscle usage or respiratory distress.     Breath sounds: No stridor. No decreased breath sounds, wheezing, rhonchi or rales.  Chest:     Chest wall: No tenderness.  Abdominal:     General: Bowel sounds are normal. There is no distension.     Palpations: Abdomen is soft. Abdomen is not rigid.     Tenderness: There is no abdominal tenderness. There is no guarding or rebound.  Musculoskeletal:        General:  Normal range of motion.     Cervical back: Normal range of motion and neck supple. No edema, erythema or rigidity. No muscular tenderness. Normal range of motion.  Lymphadenopathy:     Head:     Right side of head: No submental or submandibular adenopathy.     Left side of head: No submental or submandibular adenopathy.     Cervical: No cervical adenopathy.  Skin:    General: Skin is warm and dry.     Coloration: Skin is not pale.     Findings: No rash.  Nails: There is no clubbing.  Neurological:     Mental Status: He is alert and oriented to person, place, and time.     Sensory: No sensory deficit.  Psychiatric:        Speech: Speech normal.        Behavior: Behavior normal.         Assessment & Plan:   Problem List Items Addressed This Visit       Other   Edentulous    Currently edentulous will be getting dentures      Fecal occult blood test positive    Positive fecal occult test in the past I am going to repeat this study today he may yet need a colonoscopy but does not have insurance      Tobacco abuse - Primary       Current smoking consumption amount: 1/2 pack a day  Dicsussion on advise to quit smoking and smoking impacts: Cardiovascular impacts  Patient's willingness to quit: Wants to quit  Methods to quit smoking discussed: Nicotine replacement  Medication management of smoking session drugs discussed: Nicotine lozenge  Resources provided:  AVS   Setting quit date not established  Follow-up arranged 6 months   Time spent counseling the patient:   5 minutes      Encounter for health-related screening    Health screening labs will be obtained      Relevant Orders   Comprehensive metabolic panel   CBC with Differential/Platelet   Hemoglobin A1c   Lipid panel   Other Visit Diagnoses     Encounter for screening for HIV       Relevant Orders   HIV Antibody (routine testing w rflx)   Encounter for hepatitis C screening test for low  risk patient       Relevant Orders   HCV Ab w Reflex to Quant PCR   Colon cancer screening       Relevant Orders   Fecal occult blood, imunochemical     Health screening labs will be obtained patient declined flu vaccine  Return in about 5 months (around 08/07/2022).   Asencion Noble, MD

## 2022-03-08 NOTE — Patient Instructions (Signed)
Health screening labs to be obtained  Colon cancer screening kit will be given to process and return  No medications needed  Return Dr Joya Gaskins 5 months

## 2022-03-09 ENCOUNTER — Encounter: Payer: Self-pay | Admitting: Critical Care Medicine

## 2022-03-09 DIAGNOSIS — Z139 Encounter for screening, unspecified: Secondary | ICD-10-CM | POA: Insufficient documentation

## 2022-03-09 LAB — COMPREHENSIVE METABOLIC PANEL
ALT: 10 IU/L (ref 0–44)
AST: 17 IU/L (ref 0–40)
Albumin/Globulin Ratio: 2.8 — ABNORMAL HIGH (ref 1.2–2.2)
Albumin: 4.5 g/dL (ref 4.1–5.1)
Alkaline Phosphatase: 50 IU/L (ref 44–121)
BUN/Creatinine Ratio: 11 (ref 9–20)
BUN: 10 mg/dL (ref 6–24)
Bilirubin Total: 0.3 mg/dL (ref 0.0–1.2)
CO2: 26 mmol/L (ref 20–29)
Calcium: 9.1 mg/dL (ref 8.7–10.2)
Chloride: 102 mmol/L (ref 96–106)
Creatinine, Ser: 0.92 mg/dL (ref 0.76–1.27)
Globulin, Total: 1.6 g/dL (ref 1.5–4.5)
Glucose: 80 mg/dL (ref 70–99)
Potassium: 4.5 mmol/L (ref 3.5–5.2)
Sodium: 139 mmol/L (ref 134–144)
Total Protein: 6.1 g/dL (ref 6.0–8.5)
eGFR: 102 mL/min/{1.73_m2} (ref >59)

## 2022-03-09 LAB — CBC WITH DIFFERENTIAL/PLATELET
Basophils Absolute: 0.1 10*3/uL (ref 0.0–0.2)
Basos: 1 %
EOS (ABSOLUTE): 0.1 10*3/uL (ref 0.0–0.4)
Eos: 2 %
Hematocrit: 42.3 % (ref 37.5–51.0)
Hemoglobin: 14.4 g/dL (ref 13.0–17.7)
Immature Grans (Abs): 0 10*3/uL (ref 0.0–0.1)
Immature Granulocytes: 0 %
Lymphocytes Absolute: 3.1 10*3/uL (ref 0.7–3.1)
Lymphs: 46 %
MCH: 29.6 pg (ref 26.6–33.0)
MCHC: 34 g/dL (ref 31.5–35.7)
MCV: 87 fL (ref 79–97)
Monocytes Absolute: 0.4 10*3/uL (ref 0.1–0.9)
Monocytes: 6 %
Neutrophils Absolute: 3.1 10*3/uL (ref 1.4–7.0)
Neutrophils: 45 %
Platelets: 243 10*3/uL (ref 150–450)
RBC: 4.87 x10E6/uL (ref 4.14–5.80)
RDW: 13.5 % (ref 11.6–15.4)
WBC: 6.9 10*3/uL (ref 3.4–10.8)

## 2022-03-09 LAB — HCV INTERPRETATION

## 2022-03-09 LAB — LIPID PANEL
Chol/HDL Ratio: 2.6 ratio (ref 0.0–5.0)
Cholesterol, Total: 155 mg/dL (ref 100–199)
HDL: 59 mg/dL (ref >39)
LDL Chol Calc (NIH): 78 mg/dL (ref 0–99)
Triglycerides: 99 mg/dL (ref 0–149)
VLDL Cholesterol Cal: 18 mg/dL (ref 5–40)

## 2022-03-09 LAB — HCV AB W REFLEX TO QUANT PCR: HCV Ab: NONREACTIVE

## 2022-03-09 LAB — HEMOGLOBIN A1C
Est. average glucose Bld gHb Est-mCnc: 111 mg/dL
Hgb A1c MFr Bld: 5.5 % (ref 4.8–5.6)

## 2022-03-09 LAB — HIV ANTIBODY (ROUTINE TESTING W REFLEX): HIV Screen 4th Generation wRfx: NONREACTIVE

## 2022-03-09 NOTE — Assessment & Plan Note (Signed)
Health screening labs will be obtained

## 2022-03-09 NOTE — Assessment & Plan Note (Signed)
Positive fecal occult test in the past I am going to repeat this study today he may yet need a colonoscopy but does not have insurance

## 2022-03-09 NOTE — Assessment & Plan Note (Signed)
    Current smoking consumption amount: 1/2 pack a day  Dicsussion on advise to quit smoking and smoking impacts: Cardiovascular impacts  Patient's willingness to quit: Wants to quit  Methods to quit smoking discussed: Nicotine replacement  Medication management of smoking session drugs discussed: Nicotine lozenge  Resources provided:  AVS   Setting quit date not established  Follow-up arranged 6 months   Time spent counseling the patient:   5 minutes

## 2022-03-09 NOTE — Assessment & Plan Note (Signed)
Currently edentulous will be getting dentures

## 2022-03-09 NOTE — Progress Notes (Signed)
Let pt know all labs normal  hep C neg, hiv neg, A1C is normal no diabetes , cholesterol is normal

## 2022-03-10 ENCOUNTER — Telehealth: Payer: Self-pay

## 2022-03-10 NOTE — Telephone Encounter (Signed)
Pt was called and is aware of results, DOB was confirmed.  ?

## 2022-03-10 NOTE — Telephone Encounter (Signed)
-----  Message from Elsie Stain, MD sent at 03/09/2022  8:26 AM EST ----- Let pt know all labs normal  hep C neg, hiv neg, A1C is normal no diabetes , cholesterol is normal

## 2022-08-09 ENCOUNTER — Ambulatory Visit: Payer: 59 | Admitting: Critical Care Medicine

## 2022-10-31 NOTE — Progress Notes (Unsigned)
New Patient Office Visit  Subjective    Patient ID: Bryce Baxter, male    DOB: 1975/02/15  Age: 47 y.o. MRN: [id number]  CC:  No chief complaint on file.   HPI Bryce Baxter presents to establish care This patient was met previously at the [facility].  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  He currently has no symptoms at this time.  He is currently edentulous having had all his teeth removed and planning dentures.  He is trying to get on with the company insurance plan.  He has no complaints at this visit.  Does not drink or smoke.  Past medical history otherwise unremarkable.  No prior history of known hypertension.  Blood pressure initially elevated on arrival but on recheck is improved.  Patient is attempting to get housing.  He is currently smoking about a half a pack of cigarettes a day wishes to quit once he gets his apartment.  No outpatient encounter medications on file as of 11/01/2022.   No facility-administered encounter medications on file as of 11/01/2022.    Past Medical History:  Diagnosis Date   Kidney stones    Kidney stones 06/2019   4mm    Past Surgical History:  Procedure Laterality Date   DENTAL SURGERY     TONSILLECTOMY      Family History  Problem Relation Age of Onset   Hypertension Mother     Social History   Socioeconomic History   Marital status: Single    Spouse name: Not on file   Number of children: Not on file   Years of education: Not on file   Highest education level: Not on file  Occupational History   Not on file  Tobacco Use   Smoking status: Some Days    Current packs/day: 1.00    Average packs/day: 1 pack/day for 10.0 years (10.0 ttl pk-yrs)    Types: Cigarettes   Smokeless tobacco: Never  Substance and Sexual Activity   Alcohol use: No   Drug use: Yes    Types: Marijuana    Comment: rare   Sexual activity: Yes    Birth control/protection: Condom  Other Topics Concern   Not on file  Social  History Narrative   Not on file   Social Determinants of Health   Financial Resource Strain: Not on File (05/27/2021)   Received from [facility], [facility]    Financial Resource Strain: 0  Food Insecurity: Not on file (10/17/2022)  Transportation Needs: Not on File (05/27/2021)   Received from [facility], [facility] Needs    Transportation: 0  Physical Activity: Not on File (05/27/2021)   Received from [place], [place]   Physical Activity    Physical Activity: 0  Stress: Not on File (05/27/2021)   Received from [facility], [place]   Stress    Stress: 0  Social Connections: Not on File (10/22/2022)   Received from [facility]   Social Connections    Connectedness: 0  Intimate Partner Violence: Unknown (05/14/2021)   Received from [facility], [facility]   HITS    Physically Hurt: Not on file    Insult or Talk Down To: Not on file    Threaten Physical Harm: Not on file    Scream or Curse: Not on file    Review of Systems  Constitutional:  Negative for chills, diaphoresis, fever, malaise/fatigue and weight loss.  HENT:  Negative for congestion, hearing loss, nosebleeds,  sore throat and tinnitus.        Dental pain  Eyes:  Negative for blurred vision, photophobia and redness.  Respiratory:  Negative for cough, hemoptysis, sputum production, shortness of breath, wheezing and stridor.   Cardiovascular:  Negative for chest pain, palpitations, orthopnea, claudication, leg swelling and PND.  Gastrointestinal:  Negative for abdominal pain, blood in stool, constipation, diarrhea, heartburn, nausea and vomiting.  Genitourinary:  Negative for dysuria, flank pain, frequency, hematuria and urgency.  Musculoskeletal:  Negative for back pain, falls, joint pain, myalgias and neck pain.  Skin:  Negative for itching and rash.  Neurological:  Negative for dizziness, tingling, tremors, sensory change, speech change, focal weakness, seizures, loss of consciousness, weakness and headaches.   Endo/Heme/Allergies:  Negative for environmental allergies and polydipsia. Does not bruise/bleed easily.  Psychiatric/Behavioral:  Negative for depression, memory loss, substance abuse and suicidal ideas. The patient is not nervous/anxious and does not have insomnia.         Objective    There were no vitals taken for this visit.  Physical Exam Vitals reviewed.  Constitutional:      Appearance: Normal appearance. He is well-developed. He is not diaphoretic.  HENT:     Head: Normocephalic and atraumatic.     Nose: No nasal deformity, septal deviation, mucosal edema or rhinorrhea.     Right Sinus: No maxillary sinus tenderness or frontal sinus tenderness.     Left Sinus: No maxillary sinus tenderness or frontal sinus tenderness.     Mouth/Throat:     Mouth: Mucous membranes are moist.     Pharynx: Oropharynx is clear. No oropharyngeal exudate.     Comments: Edentulous there is some scarring in the anterior gum on the right but no active infection Eyes:     General: No scleral icterus.    Conjunctiva/sclera: Conjunctivae normal.     Pupils: Pupils are equal, round, and reactive to light.  Neck:     Thyroid: No thyromegaly.     Vascular: No carotid bruit or JVD.     Trachea: Trachea normal. No tracheal tenderness or tracheal deviation.  Cardiovascular:     Rate and Rhythm: Normal rate and regular rhythm.     Chest Wall: PMI is not displaced.     Pulses: Normal pulses. No decreased pulses.     Heart sounds: Normal heart sounds, S1 normal and S2 normal. Heart sounds not distant. No murmur heard.    No systolic murmur is present.     No diastolic murmur is present.     No friction rub. No gallop. No S3 or S4 sounds.  Pulmonary:     Effort: No tachypnea, accessory muscle usage or respiratory distress.     Breath sounds: No stridor. No decreased breath sounds, wheezing, rhonchi or rales.  Chest:     Chest wall: No tenderness.  Abdominal:     General: Bowel sounds are normal.  There is no distension.     Palpations: Abdomen is soft. Abdomen is not rigid.     Tenderness: There is no abdominal tenderness. There is no guarding or rebound.  Musculoskeletal:        General: Normal range of motion.     Cervical back: Normal range of motion and neck supple. No edema, erythema or rigidity. No muscular tenderness. Normal range of motion.  Lymphadenopathy:     Head:     Right side of head: No submental or submandibular adenopathy.     Left side of head: No  submental or submandibular adenopathy.     Cervical: No cervical adenopathy.  Skin:    General: Skin is warm and dry.     Coloration: Skin is not pale.     Findings: No rash.     Nails: There is no clubbing.  Neurological:     Mental Status: He is alert and oriented to person, place, and time.     Sensory: No sensory deficit.  Psychiatric:        Speech: Speech normal.        Behavior: Behavior normal.         Assessment & Plan:   Problem List Items Addressed This Visit   None  Health screening labs will be obtained patient declined flu vaccine  No follow-ups on file.   Shan Levans, MD

## 2022-11-01 ENCOUNTER — Telehealth: Payer: Self-pay

## 2022-11-01 ENCOUNTER — Ambulatory Visit: Payer: 59 | Attending: Critical Care Medicine | Admitting: Critical Care Medicine

## 2022-11-01 ENCOUNTER — Encounter: Payer: Self-pay | Admitting: Critical Care Medicine

## 2022-11-01 VITALS — BP 120/78 | HR 65 | Wt 166.0 lb

## 2022-11-01 DIAGNOSIS — Z5901 Sheltered homelessness: Secondary | ICD-10-CM | POA: Diagnosis not present

## 2022-11-01 DIAGNOSIS — R195 Other fecal abnormalities: Secondary | ICD-10-CM | POA: Diagnosis not present

## 2022-11-01 DIAGNOSIS — B351 Tinea unguium: Secondary | ICD-10-CM | POA: Diagnosis not present

## 2022-11-01 DIAGNOSIS — L84 Corns and callosities: Secondary | ICD-10-CM | POA: Diagnosis not present

## 2022-11-01 MED ORDER — CLOTRIMAZOLE-BETAMETHASONE 1-0.05 % EX CREA: 1.0000 | TOPICAL_CREAM | Freq: Every day | CUTANEOUS | 0 refills | Status: DC

## 2022-11-01 MED ORDER — DICLOFENAC SODIUM 75 MG PO TBEC: 75.0000 mg | DELAYED_RELEASE_TABLET | Freq: Two times a day (BID) | ORAL | 0 refills | Status: DC | PRN

## 2022-11-01 NOTE — Assessment & Plan Note (Signed)
As per foot callus

## 2022-11-01 NOTE — Telephone Encounter (Signed)
At the request of Dr Delford Field, I met with the patient when he was in the clinic today.  He explained that he has a job that he likes and is paying fairly well. He has been renting a room from an acquaintance; but that is not working out as well as he thought.  He is considering paying for a motel room while he is looking for a new apartment.    He was working with [facility] to secure permanent housing when he was staying at that shelter. However, he was asked to leave the shelter a few months ago and he is not sure why.  He agreed to call Tammy Sours to inquire if Tammy Sours would be willing to work with him again.  I told him that I would contact [facility] / Director - [employer] to see if there is anyone else who can assist the patient if Tammy Sours is not able to assist him.  He would like to know if he is eligible for Medicaid and he was in agreement to having me contact the [facility] eligibility caseworkers and request that they call  him to determine eligibility.

## 2022-11-01 NOTE — Patient Instructions (Signed)
Start diclofenac tablet one twice daily as needed for hand pain Use lotrisone cream on feet twice daily and foot moisturizer Return 4 months

## 2022-11-01 NOTE — Assessment & Plan Note (Signed)
Currently living in a motel given resources for housing

## 2022-11-01 NOTE — Assessment & Plan Note (Addendum)
Athlete's foot foot callus and onychomycosis for this will refer to podiatry and have asked patient to go to a shoe store and be properly fitted with good supporting shoes  Will provide topical Lotrisone and oral diclofenac

## 2022-11-01 NOTE — Assessment & Plan Note (Signed)
Follow-up fecal occult on repeat

## 2022-11-07 NOTE — Telephone Encounter (Signed)
I spoke to [employer] and she was not sure why the patient had to leave the shelter but she will check with Tammy Sours.  I explained that the patient wanted to speak with Tammy Sours about housing

## 2022-11-14 ENCOUNTER — Ambulatory Visit: Payer: 59 | Admitting: Podiatry

## 2022-11-21 ENCOUNTER — Encounter: Payer: Self-pay | Admitting: Podiatry

## 2022-11-21 ENCOUNTER — Ambulatory Visit (INDEPENDENT_AMBULATORY_CARE_PROVIDER_SITE_OTHER): Payer: 59 | Admitting: Podiatry

## 2022-11-21 DIAGNOSIS — M722 Plantar fascial fibromatosis: Secondary | ICD-10-CM

## 2022-11-21 DIAGNOSIS — Q828 Other specified congenital malformations of skin: Secondary | ICD-10-CM

## 2022-11-21 MED ORDER — TRIAMCINOLONE ACETONIDE 10 MG/ML IJ SUSP
10.0000 mg | Freq: Once | INTRAMUSCULAR | Status: AC
  Administered 2022-11-21: 10 mg via INTRA_ARTICULAR

## 2022-11-21 NOTE — Progress Notes (Signed)
Subjective:   Patient ID: [name], male   DOB: 47 y.o.   MRN: [id number]   HPI Patient states he is getting a lot of pain on the bottom of his right arch and he does have some discoloration of his nailbeds and does have an prominence around the first metatarsal of the left.  Patient does smoke periodically tries to stay active   Review of Systems  All other systems reviewed and are negative.       Objective:  Physical Exam Vitals and nursing note reviewed.  Constitutional:      Appearance: He is well-developed.  Pulmonary:     Effort: Pulmonary effort is normal.  Musculoskeletal:        General: Normal range of motion.  Skin:    General: Skin is warm.  Neurological:     Mental Status: He is alert.     Neurovascular status was found to be intact muscle strength was found to be adequate range of motion adequate with inflammation pain around the mid arch area right fluid buildup very tender when pressed nail disease with thickness localized structural bunion deformity left     Assessment:  Plantar fasciitis with acute inflammation right arch along with nail disease and bunion formation     Plan:  H&P reviewed all conditions and went ahead today injected the plantar fascia right 3 mg Kenalog 5 mg Xylocaine and applied fascial brace to lift the arch up to take all pressure off the plantar fascia.  I then do not recommend treatment for nails lesions but I did discuss that with

## 2022-11-21 NOTE — Patient Instructions (Signed)

## 2022-12-07 ENCOUNTER — Other Ambulatory Visit: Payer: Self-pay | Admitting: Critical Care Medicine

## 2022-12-07 ENCOUNTER — Encounter: Payer: Self-pay | Admitting: Physician Assistant

## 2022-12-07 ENCOUNTER — Encounter: Payer: Self-pay | Admitting: Critical Care Medicine

## 2022-12-07 MED ORDER — CLOTRIMAZOLE-BETAMETHASONE 1-0.05 % EX CREA: 1.0000 | TOPICAL_CREAM | Freq: Every day | CUTANEOUS | 0 refills | Status: DC

## 2022-12-07 MED ORDER — AMOXICILLIN-POT CLAVULANATE 875-125 MG PO TABS: 1.0000 | ORAL_TABLET | Freq: Two times a day (BID) | ORAL | 0 refills | Status: AC

## 2022-12-07 MED ORDER — DICLOFENAC SODIUM 75 MG PO TBEC: 75.0000 mg | DELAYED_RELEASE_TABLET | Freq: Two times a day (BID) | ORAL | 0 refills | Status: DC | PRN

## 2022-12-07 NOTE — Progress Notes (Signed)
Pt c/o sore throat, hard to eat.  Still smoking, 2 cigs a day.  No fevers, no cough, voice is harsh, getting harder to talk.  Some erythema in throat. Ears are ok.    Has heartburn when he eats hamburger.  Gets a bad taste in his mouth. This is worse at night.   He has pain in his feet, both burning and other pain.   Saw the foot Dr and got an injection in his foot, but that helped only briefly.   He wears crocs, ordered shoes in black for work, they were a little small but he has to wear them. Wears size 12, we were able to give him a pair of tennis shoes. He was given support insoles, which may help his foot pain.  PW wrote a note to say he could wear the new shoes to work. He works at [employer].  Oral Voltaren was renewed, was given Augmentin and Lotrisone cream.   Theodore Demark, PA-C 12/07/2022 2:27 PM

## 2022-12-08 NOTE — Progress Notes (Signed)
Pt seen at [facility] shelter clinic  Sore throat for three days Exam shows erythema and nasal area inflammed  IMP acute pharyngitis Plan augmentin x 7 days  Also has plantar fascitis , supplied patient with pair of shoes size 12 with inserts

## 2023-03-06 ENCOUNTER — Ambulatory Visit: Payer: 59 | Admitting: Family Medicine

## 2023-04-27 ENCOUNTER — Ambulatory Visit: Payer: 59 | Admitting: Family Medicine

## 2023-05-15 NOTE — Progress Notes (Deleted)
 New Patient Office Visit  Subjective    Patient ID: Bryce Baxter, male    DOB: 21-Aug-1975  Age: 48 y.o. MRN: [id number]  CC:  Follow-up on tobacco use and homelessness  HPI 02/2022 Bryce Baxter presents to establish care This patient was met previously at the [facility].  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  He currently has no symptoms at this time.  He is currently edentulous having had all his teeth removed and planning dentures.  He is trying to get on with the company insurance plan.  He has no complaints at this visit.  Does not drink or smoke.  Past medical history otherwise unremarkable.  No prior history of known hypertension.  Blood pressure initially elevated on arrival but on recheck is improved.  Patient is attempting to get housing.  He is currently smoking about a half a pack of cigarettes a day wishes to quit once he gets his apartment.  9/24//24 This patient seen in return follow-up and unfortunately remains home when he was at the shelter but was exited from this because of behavior and now is living in a motel the [facility] in.  He is still smoking about a pack a day of cigarettes.  Blood pressure on arrival is 120/78.  The patient does have significant foot callus and toenail fungus would benefit from improved fit to the shoes.  Also has bilateral wrist pain.  He is now working at a [employer].  He is waiting on dentures.  05/18/23  Outpatient Encounter Medications as of 05/18/2023  Medication Sig   clotrimazole-betamethasone (LOTRISONE) cream Apply 1 Application topically daily. To affected area   diclofenac (VOLTAREN) 75 MG EC tablet Take 1 tablet (75 mg total) by mouth 2 (two) times daily as needed.   No facility-administered encounter medications on file as of 05/18/2023.    Past Medical History:  Diagnosis Date   Kidney stones    Kidney stones 06/2019   4mm    Past Surgical History:  Procedure Laterality Date   DENTAL SURGERY      TONSILLECTOMY      Family History  Problem Relation Age of Onset   Hypertension Mother     Social History   Socioeconomic History   Marital status: Single    Spouse name: Not on file   Number of children: Not on file   Years of education: Not on file   Highest education level: Not on file  Occupational History   Not on file  Tobacco Use   Smoking status: Some Days    Current packs/day: 1.00    Average packs/day: 1 pack/day for 10.0 years (10.0 ttl pk-yrs)    Types: Cigarettes   Smokeless tobacco: Never  Substance and Sexual Activity   Alcohol use: No   Drug use: Yes    Types: Marijuana    Comment: rare   Sexual activity: Yes    Birth control/protection: Condom  Other Topics Concern   Not on file  Social History Narrative   Not on file   Social Drivers of Health   Financial Resource Strain: Not on File (05/27/2021)   Received from [facility], [facility]    Financial Resource Strain: 0  Food Insecurity: Not on File (11/03/2022)   Received from [facility] Insecurity    Food: 0  Transportation Needs: Not on File (05/27/2021)   Received from [place], [facility] Needs  Transportation: 0  Physical Activity: Not on File (05/27/2021)   Received from [place], [place]   Physical Activity    Physical Activity: 0  Stress: Not on File (05/27/2021)   Received from [facility], [place]   Stress    Stress: 0  Social Connections: Not on File (10/22/2022)   Received from [facility]   Social Connections    Connectedness: 0  Intimate Partner Violence: Unknown (05/14/2021)   Received from [facility], [facility]   HITS    Physically Hurt: Not on file    Insult or Talk Down To: Not on file    Threaten Physical Harm: Not on file    Scream or Curse: Not on file    Review of Systems  Constitutional:  Negative for chills, diaphoresis, fever, malaise/fatigue and weight loss.  HENT:  Negative for congestion, hearing loss, nosebleeds, sore throat and  tinnitus.        Dental pain  Eyes:  Negative for blurred vision, photophobia and redness.  Respiratory:  Negative for cough, hemoptysis, sputum production, shortness of breath, wheezing and stridor.   Cardiovascular:  Negative for chest pain, palpitations, orthopnea, claudication, leg swelling and PND.  Gastrointestinal:  Negative for abdominal pain, blood in stool, constipation, diarrhea, heartburn, nausea and vomiting.  Genitourinary:  Negative for dysuria, flank pain, frequency, hematuria and urgency.  Musculoskeletal:  Negative for back pain, falls, joint pain, myalgias and neck pain.  Skin:  Negative for itching and rash.  Neurological:  Negative for dizziness, tingling, tremors, sensory change, speech change, focal weakness, seizures, loss of consciousness, weakness and headaches.  Endo/Heme/Allergies:  Negative for environmental allergies and polydipsia. Does not bruise/bleed easily.  Psychiatric/Behavioral:  Negative for depression, memory loss, substance abuse and suicidal ideas. The patient is not nervous/anxious and does not have insomnia.         Objective    There were no vitals taken for this visit.  Physical Exam Vitals reviewed.  Constitutional:      Appearance: Normal appearance. He is well-developed. He is not diaphoretic.  HENT:     Head: Normocephalic and atraumatic.     Nose: No nasal deformity, septal deviation, mucosal edema or rhinorrhea.     Right Sinus: No maxillary sinus tenderness or frontal sinus tenderness.     Left Sinus: No maxillary sinus tenderness or frontal sinus tenderness.     Mouth/Throat:     Mouth: Mucous membranes are moist.     Pharynx: Oropharynx is clear. No oropharyngeal exudate.     Comments: Edentulous there is some scarring in the anterior gum on the right but no active infection Eyes:     General: No scleral icterus.    Conjunctiva/sclera: Conjunctivae normal.     Pupils: Pupils are equal, round, and reactive to light.  Neck:      Thyroid: No thyromegaly.     Vascular: No carotid bruit or JVD.     Trachea: Trachea normal. No tracheal tenderness or tracheal deviation.  Cardiovascular:     Rate and Rhythm: Normal rate and regular rhythm.     Chest Wall: PMI is not displaced.     Pulses: Normal pulses. No decreased pulses.     Heart sounds: Normal heart sounds, S1 normal and S2 normal. Heart sounds not distant. No murmur heard.    No systolic murmur is present.     No diastolic murmur is present.     No friction rub. No gallop. No S3 or S4 sounds.  Pulmonary:  Effort: No tachypnea, accessory muscle usage or respiratory distress.     Breath sounds: No stridor. No decreased breath sounds, wheezing, rhonchi or rales.  Chest:     Chest wall: No tenderness.  Abdominal:     General: Bowel sounds are normal. There is no distension.     Palpations: Abdomen is soft. Abdomen is not rigid.     Tenderness: There is no abdominal tenderness. There is no guarding or rebound.  Musculoskeletal:        General: Normal range of motion.     Cervical back: Normal range of motion and neck supple. No edema, erythema or rigidity. No muscular tenderness. Normal range of motion.     Comments: Foot callus seen and toenail onychomycosis along with athlete's foot  Lymphadenopathy:     Head:     Right side of head: No submental or submandibular adenopathy.     Left side of head: No submental or submandibular adenopathy.     Cervical: No cervical adenopathy.  Skin:    General: Skin is warm and dry.     Coloration: Skin is not pale.     Findings: No rash.     Nails: There is no clubbing.  Neurological:     Mental Status: He is alert and oriented to person, place, and time.     Sensory: No sensory deficit.  Psychiatric:        Speech: Speech normal.        Behavior: Behavior normal.         Assessment & Plan:   Problem List Items Addressed This Visit   None   Health screening labs will be obtained patient declined flu  vaccine  No follow-ups on file.   Shan Levans, MD

## 2023-05-18 ENCOUNTER — Ambulatory Visit: Admitting: Critical Care Medicine

## 2023-05-23 ENCOUNTER — Telehealth: Payer: Self-pay

## 2023-05-23 NOTE — Telephone Encounter (Signed)
 FYI  Copied from CRM [id number]. Topic: Clinical - Lab/Test Results >> May 23, 2023 12:13 PM Everlene Hobby D wrote:  I let patient know that his lab results would be sent to him by mail

## 2023-05-23 NOTE — Telephone Encounter (Signed)
 Copied from CRM [id number]. Topic: General - Other >> May 22, 2023  3:30 PM Santiya F wrote: Reason for CRM: Patient is calling in requesting a copy of his most recent lab results  be sent to his email on file. Patient says he is unable to get into MyChart and would prefer them emailed. He needs the copy for work.

## 2023-05-23 NOTE — Telephone Encounter (Signed)
 Call to advised patient that we are unable to email lab results and we would be glad to mail a copy to him. Unable to reach and VM not set-up. Copy of recent labs mailed to patient address on chart.

## 2023-06-22 ENCOUNTER — Ambulatory Visit: Admitting: Critical Care Medicine

## 2023-06-29 ENCOUNTER — Ambulatory Visit: Payer: Self-pay

## 2023-06-29 NOTE — Telephone Encounter (Signed)
 Chief Complaint: pressure in buttock Symptoms: pressure in buttocks Frequency: x 3 days Pertinent Negatives: Patient denies issues Disposition: [] ED /[] Urgent Care (no appt availability in office) / [] Appointment(In office/virtual)/ []  [facility]/ [x] Home Care/ [] Refused Recommended Disposition /[] [facility]/ []  Follow-up with PCP Additional Notes: Pt states that he works in [employer] and does a lot of heavy lifting. States that he is trying to see if this will make his hemorrhoids worse. States that he is discomfort when sitting, states pressure in his rectum. Denies blood in stool. States he has a BM daily and it is normal. States pressure x 3 days and does strain some when having a BM.   Copied From CRM [id number]. Reason for Triage: Pt has a hemorrhoid and has concerns about working today, seeking advice.   Best contact: [phone]  Reason for Disposition  Mild rectal pain  Protocols used: Rectal Symptoms-A-AH

## 2023-07-12 NOTE — Progress Notes (Deleted)
 New Patient Office Visit  Subjective    Patient ID: Bryce Baxter, male    DOB: 20-Nov-1975  Age: 48 y.o. MRN: [id number]  CC:  Follow-up on tobacco use and homelessness  HPI 02/2022 Bryce Baxter presents to establish care This patient was met previously at the [facility].  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  He currently has no symptoms at this time.  He is currently edentulous having had all his teeth removed and planning dentures.  He is trying to get on with the company insurance plan.  He has no complaints at this visit.  Does not drink or smoke.  Past medical history otherwise unremarkable.  No prior history of known hypertension.  Blood pressure initially elevated on arrival but on recheck is improved.  Patient is attempting to get housing.  He is currently smoking about a half a pack of cigarettes a day wishes to quit once he gets his apartment.  9/24//24 This patient seen in return follow-up and unfortunately remains home when he was at the shelter but was exited from this because of behavior and now is living in a motel the [place] in.  He is still smoking about a pack a day of cigarettes.  Blood pressure on arrival is 120/78.  The patient does have significant foot callus and toenail fungus would benefit from improved fit to the shoes.  Also has bilateral wrist pain.  He is now working at a [employer].  He is waiting on dentures.   07/12/23  Outpatient Encounter Medications as of 07/13/2023  Medication Sig   clotrimazole -betamethasone  (LOTRISONE ) cream Apply 1 Application topically daily. To affected area   diclofenac  (VOLTAREN ) 75 MG EC tablet Take 1 tablet (75 mg total) by mouth 2 (two) times daily as needed.   No facility-administered encounter medications on file as of 07/13/2023.    Past Medical History:  Diagnosis Date   Kidney stones    Kidney stones 06/2019   4mm    Past Surgical History:  Procedure Laterality Date   DENTAL SURGERY      TONSILLECTOMY      Family History  Problem Relation Age of Onset   Hypertension Mother     Social History   Socioeconomic History   Marital status: Single    Spouse name: Not on file   Number of children: Not on file   Years of education: Not on file   Highest education level: Not on file  Occupational History   Not on file  Tobacco Use   Smoking status: Some Days    Current packs/day: 1.00    Average packs/day: 1 pack/day for 10.0 years (10.0 ttl pk-yrs)    Types: Cigarettes   Smokeless tobacco: Never  Substance and Sexual Activity   Alcohol use: No   Drug use: Yes    Types: Marijuana    Comment: rare   Sexual activity: Yes    Birth control/protection: Condom  Other Topics Concern   Not on file  Social History Narrative   Not on file   Social Drivers of Health   Financial Resource Strain: Not on File (05/27/2021)   Received from [facility], [facility]    Financial Resource Strain: 0  Food Insecurity: Not on File (11/03/2022)   Received from [facility] Insecurity    Food: 0  Transportation Needs: Not on File (05/27/2021)   Received from [facility], [facility] Needs  Transportation: 0  Physical Activity: Not on File (05/27/2021)   Received from [place], [place]   Physical Activity    Physical Activity: 0  Stress: Not on File (05/27/2021)   Received from [facility], [place]   Stress    Stress: 0  Social Connections: Not on File (10/22/2022)   Received from [facility]   Social Connections    Connectedness: 0  Intimate Partner Violence: Unknown (05/14/2021)   Received from [facility], [facility]   HITS    Physically Hurt: Not on file    Insult or Talk Down To: Not on file    Threaten Physical Harm: Not on file    Scream or Curse: Not on file    Review of Systems  Constitutional:  Negative for chills, diaphoresis, fever, malaise/fatigue and weight loss.  HENT:  Negative for congestion, hearing loss, nosebleeds, sore throat and  tinnitus.        Dental pain  Eyes:  Negative for blurred vision, photophobia and redness.  Respiratory:  Negative for cough, hemoptysis, sputum production, shortness of breath, wheezing and stridor.   Cardiovascular:  Negative for chest pain, palpitations, orthopnea, claudication, leg swelling and PND.  Gastrointestinal:  Negative for abdominal pain, blood in stool, constipation, diarrhea, heartburn, nausea and vomiting.  Genitourinary:  Negative for dysuria, flank pain, frequency, hematuria and urgency.  Musculoskeletal:  Negative for back pain, falls, joint pain, myalgias and neck pain.  Skin:  Negative for itching and rash.  Neurological:  Negative for dizziness, tingling, tremors, sensory change, speech change, focal weakness, seizures, loss of consciousness, weakness and headaches.  Endo/Heme/Allergies:  Negative for environmental allergies and polydipsia. Does not bruise/bleed easily.  Psychiatric/Behavioral:  Negative for depression, memory loss, substance abuse and suicidal ideas. The patient is not nervous/anxious and does not have insomnia.         Objective    There were no vitals taken for this visit.  Physical Exam Vitals reviewed.  Constitutional:      Appearance: Normal appearance. He is well-developed. He is not diaphoretic.  HENT:     Head: Normocephalic and atraumatic.     Nose: No nasal deformity, septal deviation, mucosal edema or rhinorrhea.     Right Sinus: No maxillary sinus tenderness or frontal sinus tenderness.     Left Sinus: No maxillary sinus tenderness or frontal sinus tenderness.     Mouth/Throat:     Mouth: Mucous membranes are moist.     Pharynx: Oropharynx is clear. No oropharyngeal exudate.     Comments: Edentulous there is some scarring in the anterior gum on the right but no active infection Eyes:     General: No scleral icterus.    Conjunctiva/sclera: Conjunctivae normal.     Pupils: Pupils are equal, round, and reactive to light.  Neck:      Thyroid: No thyromegaly.     Vascular: No carotid bruit or JVD.     Trachea: Trachea normal. No tracheal tenderness or tracheal deviation.  Cardiovascular:     Rate and Rhythm: Normal rate and regular rhythm.     Chest Wall: PMI is not displaced.     Pulses: Normal pulses. No decreased pulses.     Heart sounds: Normal heart sounds, S1 normal and S2 normal. Heart sounds not distant. No murmur heard.    No systolic murmur is present.     No diastolic murmur is present.     No friction rub. No gallop. No S3 or S4 sounds.  Pulmonary:  Effort: No tachypnea, accessory muscle usage or respiratory distress.     Breath sounds: No stridor. No decreased breath sounds, wheezing, rhonchi or rales.  Chest:     Chest wall: No tenderness.  Abdominal:     General: Bowel sounds are normal. There is no distension.     Palpations: Abdomen is soft. Abdomen is not rigid.     Tenderness: There is no abdominal tenderness. There is no guarding or rebound.  Musculoskeletal:        General: Normal range of motion.     Cervical back: Normal range of motion and neck supple. No edema, erythema or rigidity. No muscular tenderness. Normal range of motion.     Comments: Foot callus seen and toenail onychomycosis along with athlete's foot  Lymphadenopathy:     Head:     Right side of head: No submental or submandibular adenopathy.     Left side of head: No submental or submandibular adenopathy.     Cervical: No cervical adenopathy.  Skin:    General: Skin is warm and dry.     Coloration: Skin is not pale.     Findings: No rash.     Nails: There is no clubbing.  Neurological:     Mental Status: He is alert and oriented to person, place, and time.     Sensory: No sensory deficit.  Psychiatric:        Speech: Speech normal.        Behavior: Behavior normal.         Assessment & Plan:   Problem List Items Addressed This Visit   None   Health screening labs will be obtained patient declined flu  vaccine  No follow-ups on file.   Arlene Lacy, MD

## 2023-07-13 ENCOUNTER — Ambulatory Visit: Admitting: Critical Care Medicine

## 2023-08-30 ENCOUNTER — Telehealth: Payer: Self-pay | Admitting: Family Medicine

## 2023-08-30 ENCOUNTER — Telehealth: Payer: Self-pay | Admitting: Critical Care Medicine

## 2023-08-30 NOTE — Telephone Encounter (Signed)
 error

## 2023-08-30 NOTE — Telephone Encounter (Signed)
 pt unconfirmed appt (per volunteer 7/23 lvm )

## 2023-08-31 ENCOUNTER — Ambulatory Visit: Admitting: Family Medicine

## 2023-09-17 NOTE — Progress Notes (Signed)
 est Patient Office Visit  Subjective    Patient ID: Bryce Baxter, male    DOB: 1975/03/22  Age: 48 y.o. MRN: [id number]  CC:  Follow-up on tobacco use and homelessness  HPI 02/2022 Orris Perin presents to establish care This patient was met previously at the [facility].  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  He currently has no symptoms at this time.  He is currently edentulous having had all his teeth removed and planning dentures.  He is trying to get on with the company insurance plan.  He has no complaints at this visit.  Does not drink or smoke.  Past medical history otherwise unremarkable.  No prior history of known hypertension.  Blood pressure initially elevated on arrival but on recheck is improved.  Patient is attempting to get housing.  He is currently smoking about a half a pack of cigarettes a day wishes to quit once he gets his apartment.  9/24//24 This patient seen in return follow-up and unfortunately remains homeless  when he was at the shelter but was exited from this because of behavior and now is living in a motel [place] in.  He is still smoking about a pack a day of cigarettes.  Blood pressure on arrival is 120/78.  The patient does have significant foot callus and toenail fungus would benefit from improved fit to the shoes.  Also has bilateral wrist pain.  He is now working at a [employer].  He is waiting on dentures.  09/21/23 The patient returns for short-term follow-up he now has housing he is sharing an apartment with a friend and they get along well.  He states he is getting dentures made at a reduced cost.  He had had some wrist pain but this is resolved.  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  He needs colon cancer screening.  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  Still suffers from severe onychomycosis and and calluses in the feet.  He did not have insurance the last time we saw him so we did not refer him to  podiatry.  There are no other complaints.  Blood pressure is normal.  He is reducing his tobacco intake.  Outpatient Encounter Medications as of 09/21/2023  Medication Sig   terbinafine  (LAMISIL ) 250 MG tablet Take 1 tablet (250 mg total) by mouth daily.   [DISCONTINUED] clotrimazole -betamethasone  (LOTRISONE ) cream Apply 1 Application topically daily. To affected area   [DISCONTINUED] diclofenac  (VOLTAREN ) 75 MG EC tablet Take 1 tablet (75 mg total) by mouth 2 (two) times daily as needed.   No facility-administered encounter medications on file as of 09/21/2023.    Past Medical History:  Diagnosis Date   Kidney stones    Kidney stones 06/2019   4mm    Past Surgical History:  Procedure Laterality Date   DENTAL SURGERY     TONSILLECTOMY      Family History  Problem Relation Age of Onset   Hypertension Mother     Social History   Socioeconomic History   Marital status: Single    Spouse name: Not on file   Number of children: Not on file   Years of education: Not on file   Highest education level: Not on file  Occupational History   Not on file  Tobacco Use   Smoking status: Some Days    Current packs/day: 1.00    Average packs/day: 1 pack/day for 10.0 years (  10.0 ttl pk-yrs)    Types: Cigarettes   Smokeless tobacco: Never  Substance and Sexual Activity   Alcohol use: No   Drug use: Yes    Types: Marijuana    Comment: rare   Sexual activity: Yes    Birth control/protection: Condom  Other Topics Concern   Not on file  Social History Narrative   Not on file   Social Drivers of Health   Financial Resource Strain: Not on File (05/27/2021)   Received from [facility]    Financial Resource Strain: 0  Food Insecurity: Not on File (11/03/2022)   Received from [facility] Insecurity    Food: 0  Transportation Needs: Not on File (05/27/2021)   Received from [facility] Needs    Transportation: 0  Physical Activity: Not on File  (05/27/2021)   Received from [facility]   Physical Activity    Physical Activity: 0  Stress: Not on File (05/27/2021)   Received from [facility]   Stress    Stress: 0  Social Connections: Not on File (10/22/2022)   Received from [facility]   Social Connections    Connectedness: 0  Intimate Partner Violence: Unknown (05/14/2021)   Received from [facility]   HITS    Physically Hurt: Not on file    Insult or Talk Down To: Not on file    Threaten Physical Harm: Not on file    Scream or Curse: Not on file    Review of Systems  Constitutional:  Negative for chills, diaphoresis, fever, malaise/fatigue and weight loss.  HENT:  Negative for congestion, hearing loss, nosebleeds, sore throat and tinnitus.        Dental pain  Eyes:  Negative for blurred vision, photophobia and redness.  Respiratory:  Negative for cough, hemoptysis, sputum production, shortness of breath, wheezing and stridor.   Cardiovascular:  Negative for chest pain, palpitations, orthopnea, claudication, leg swelling and PND.  Gastrointestinal:  Negative for abdominal pain, blood in stool, constipation, diarrhea, heartburn, nausea and vomiting.  Genitourinary:  Negative for dysuria, flank pain, frequency, hematuria and urgency.  Musculoskeletal:  Positive for joint pain. Negative for back pain, falls, myalgias and neck pain.  Skin:  Negative for itching and rash.  Neurological:  Negative for dizziness, tingling, tremors, sensory change, speech change, focal weakness, seizures, loss of consciousness, weakness and headaches.  Endo/Heme/Allergies:  Negative for environmental allergies and polydipsia. Does not bruise/bleed easily.  Psychiatric/Behavioral:  Negative for depression, memory loss, substance abuse and suicidal ideas. The patient is not nervous/anxious and does not have insomnia.         Objective    BP 122/82 (BP Location: Left Arm, Patient Position: Sitting, Cuff Size: Normal)   Pulse (!) 55   Temp 98.4 F (36.9 C) (Oral)    Ht 6' (1.829 m)   Wt 168 lb 12.8 oz (76.6 kg)   SpO2 99%   BMI 22.89 kg/m   Physical Exam Vitals reviewed.  Constitutional:      Appearance: Normal appearance. He is well-developed. He is not diaphoretic.  HENT:     Head: Normocephalic and atraumatic.     Nose: No nasal deformity, septal deviation, mucosal edema or rhinorrhea.     Right Sinus: No maxillary sinus tenderness or frontal sinus tenderness.     Left Sinus: No maxillary sinus tenderness or frontal sinus tenderness.     Mouth/Throat:     Mouth: Mucous membranes are moist.  Pharynx: Oropharynx is clear. No oropharyngeal exudate.     Comments: Edentulous there is some scarring in the anterior gum on the right but no active infection Eyes:     General: No scleral icterus.    Conjunctiva/sclera: Conjunctivae normal.     Pupils: Pupils are equal, round, and reactive to light.  Neck:     Thyroid: No thyromegaly.     Vascular: No carotid bruit or JVD.     Trachea: Trachea normal. No tracheal tenderness or tracheal deviation.  Cardiovascular:     Rate and Rhythm: Normal rate and regular rhythm.     Chest Wall: PMI is not displaced.     Pulses: Normal pulses. No decreased pulses.     Heart sounds: Normal heart sounds, S1 normal and S2 normal. Heart sounds not distant. No murmur heard.    No systolic murmur is present.     No diastolic murmur is present.     No friction rub. No gallop. No S3 or S4 sounds.  Pulmonary:     Effort: No tachypnea, accessory muscle usage or respiratory distress.     Breath sounds: No stridor. No decreased breath sounds, wheezing, rhonchi or rales.  Chest:     Chest wall: No tenderness.  Abdominal:     General: Bowel sounds are normal. There is no distension.     Palpations: Abdomen is soft. Abdomen is not rigid.     Tenderness: There is no abdominal tenderness. There is no guarding or rebound.  Musculoskeletal:        General: Normal range of motion.     Cervical back: Normal range of  motion and neck supple. No edema, erythema or rigidity. No muscular tenderness. Normal range of motion.     Comments: Foot callus seen and toenail onychomycosis along with athlete's foot  Lymphadenopathy:     Head:     Right side of head: No submental or submandibular adenopathy.     Left side of head: No submental or submandibular adenopathy.     Cervical: No cervical adenopathy.  Skin:    General: Skin is warm and dry.     Coloration: Skin is not pale.     Findings: No rash.     Nails: There is no clubbing.  Neurological:     Mental Status: He is alert and oriented to person, place, and time.     Sensory: No sensory deficit.  Psychiatric:        Speech: Speech normal.        Behavior: Behavior normal.         Assessment & Plan:   Problem List Items Addressed This Visit       Musculoskeletal and Integument   Onychomycosis   Onychomycosis with associated tinea pedis will give oral terbinafine  250 mg daily for 2 weeks and reassess response      Relevant Medications   terbinafine  (LAMISIL ) 250 MG tablet   Foot callus   Have ordered topical moisturizer cream        Other   Edentulous   Patient is working on dentures      Fecal occult blood test positive   Previous test positive will repeat same now that he has insurance he may yet need colonoscopy      Tobacco abuse      Current smoking consumption amount: 1/2 pack a day  Dicsussion on advise to quit smoking and smoking impacts: Cardiovascular impacts  Patient's willingness to quit: Wants to quit  Methods to quit smoking discussed: Nicotine replacement  Medication management of smoking session drugs discussed: Nicotine lozenge  Resources provided:  AVS   Setting quit date not established  Follow-up arranged 6 months   Time spent counseling the patient:   5 minutes      Sheltered homelessness   Currently sharing an apartment with another individual to get along well he is at risk for recurrent  homelessness but right now is stable      Other Visit Diagnoses       Colon cancer screening    -  Primary   Relevant Orders   Fecal occult blood, imunochemical       Return for primary care follow up.   Belvie Silvan, MD

## 2023-09-21 ENCOUNTER — Ambulatory Visit: Attending: Critical Care Medicine | Admitting: Critical Care Medicine

## 2023-09-21 ENCOUNTER — Encounter: Payer: Self-pay | Admitting: Critical Care Medicine

## 2023-09-21 VITALS — BP 122/82 | HR 55 | Temp 98.4°F | Ht 72.0 in | Wt 168.8 lb

## 2023-09-21 DIAGNOSIS — L84 Corns and callosities: Secondary | ICD-10-CM | POA: Diagnosis not present

## 2023-09-21 DIAGNOSIS — Z720 Tobacco use: Secondary | ICD-10-CM | POA: Diagnosis not present

## 2023-09-21 DIAGNOSIS — Z5901 Sheltered homelessness: Secondary | ICD-10-CM

## 2023-09-21 DIAGNOSIS — B351 Tinea unguium: Secondary | ICD-10-CM | POA: Diagnosis not present

## 2023-09-21 DIAGNOSIS — K08109 Complete loss of teeth, unspecified cause, unspecified class: Secondary | ICD-10-CM

## 2023-09-21 DIAGNOSIS — R195 Other fecal abnormalities: Secondary | ICD-10-CM

## 2023-09-21 DIAGNOSIS — Z1211 Encounter for screening for malignant neoplasm of colon: Secondary | ICD-10-CM | POA: Diagnosis not present

## 2023-09-21 MED ORDER — TERBINAFINE HCL 250 MG PO TABS: 250.0000 mg | ORAL_TABLET | Freq: Every day | ORAL | 0 refills | Status: DC

## 2023-09-21 NOTE — Assessment & Plan Note (Signed)
 Currently sharing an apartment with another individual to get along well he is at risk for recurrent homelessness but right now is stable

## 2023-09-21 NOTE — Assessment & Plan Note (Signed)
   Current smoking consumption amount: 1/2 pack a day  Dicsussion on advise to quit smoking and smoking impacts: Cardiovascular impacts  Patient's willingness to quit: Wants to quit  Methods to quit smoking discussed: Nicotine replacement  Medication management of smoking session drugs discussed: Nicotine lozenge  Resources provided:  AVS   Setting quit date not established  Follow-up arranged 6 months   Time spent counseling the patient:   5 minutes

## 2023-09-21 NOTE — Assessment & Plan Note (Signed)
 Onychomycosis with associated tinea pedis will give oral terbinafine  250 mg daily for 2 weeks and reassess response

## 2023-09-21 NOTE — Patient Instructions (Addendum)
 Start terbinafine  1 daily for 2 weeks for fungus on toenails and on skin between toes  Obtain a good foot cream moisturizer you can get this at the [facility] look in the footcare section you want to treat both your back of your feet in front of your feet apply it twice daily  Call us  and let us  know if the toenail fungus and rash and pain in your toes have not improved if this be the case we will refer you to a foot podiatry doctor  Colon cancer screening will be obtained  We are very glad you now have housing  Return for primary care follow-up 1 year tell the

## 2023-09-21 NOTE — Assessment & Plan Note (Signed)
 Patient is working on dentures

## 2023-09-21 NOTE — Assessment & Plan Note (Signed)
 Have ordered topical moisturizer cream

## 2023-09-21 NOTE — Assessment & Plan Note (Signed)
 Previous test positive will repeat same now that he has insurance he may yet need colonoscopy

## 2023-12-25 ENCOUNTER — Ambulatory Visit: Admitting: Family Medicine

## 2024-02-20 ENCOUNTER — Ambulatory Visit: Admitting: Family Medicine

## 2024-02-28 ENCOUNTER — Ambulatory Visit: Payer: Self-pay | Attending: Family Medicine | Admitting: Family Medicine

## 2024-02-28 ENCOUNTER — Encounter: Payer: Self-pay | Admitting: Family Medicine

## 2024-02-28 VITALS — BP 117/69 | HR 59 | Temp 98.5°F | Ht 72.0 in | Wt 176.8 lb

## 2024-02-28 DIAGNOSIS — B351 Tinea unguium: Secondary | ICD-10-CM

## 2024-02-28 DIAGNOSIS — M1811 Unilateral primary osteoarthritis of first carpometacarpal joint, right hand: Secondary | ICD-10-CM

## 2024-02-28 DIAGNOSIS — M67449 Ganglion, unspecified hand: Secondary | ICD-10-CM

## 2024-02-28 MED ORDER — TERBINAFINE HCL 250 MG PO TABS: 250.0000 mg | ORAL_TABLET | Freq: Every day | ORAL | 0 refills | Status: AC

## 2024-02-28 NOTE — Patient Instructions (Signed)
 VISIT SUMMARY:  During your visit, we discussed the lump on your right thumb, your thickened toenails, and general health maintenance. We reviewed your current health status and planned the next steps for your care.  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 Osteoarthritis is a condition that causes the cartilage in your joints to wear down over time. Since your cyst and osteoarthritis are not causing significant discomfort or impairment, we will monitor the condition unless symptoms worsen. If you decide you want the cyst removed in the future, we can refer you to a hand surgeon.  -ONYCHOMYCOSIS: Onychomycosis is a fungal infection that affects the nails, causing them to thicken and discolor. We have ordered liver function tests to monitor your health while you take terbinafine , an antifungal medication. Your prescription for terbinafine  has been sent to the pharmacy.  -GENERAL HEALTH MAINTENANCE: We discussed routine health maintenance, including the need for updated blood work since you have not had any in two years. We have ordered blood tests to check your kidney and liver function and screened for diabetes. Your recent stool test for colon cancer screening was completed.  INSTRUCTIONS:  Please complete the blood tests and liver function tests as ordered. Start taking the terbinafine  medication as prescribed. Monitor the lump on your thumb for any changes or worsening symptoms. If you experience any issues or decide you want the cyst removed, please let us  know. Follow up with us  after completing the tests or if you have any concerns.

## 2024-02-28 NOTE — Progress Notes (Signed)
 "  Subjective:  Patient ID: Bryce Baxter, male    DOB: 06-18-1975  Age: 49 y.o. MRN: [id number]  CC: Medical Management of Chronic Issues (Lump in Right thumb X1 year )     Discussed the use of AI scribe software for clinical note transcription with the patient, who gave verbal consent to proceed.  History of Present Illness Bryce Baxter is a 49 year old male who presents with a lump on his right thumb.  He has had a lump on his right thumb for almost a year. It has been stable in size, is not itchy or painful, and does not affect function despite him being right-handed.  He is not taking any medications. He was prescribed oral medication for nail fungus but did not start it due to cost. He now wants to begin treatment because of thickened toenails.  He has not had blood work for almost two years, with the last tests in 2024. He recently completed a stool test for colon cancer screening provided at his last visit.    Past Medical History:  Diagnosis Date   Kidney stones    Kidney stones 06/2019   4mm    Past Surgical History:  Procedure Laterality Date   DENTAL SURGERY     TONSILLECTOMY      Family History  Problem Relation Age of Onset   Hypertension Mother     Social History   Socioeconomic History   Marital status: Single    Spouse name: Not on file   Number of children: Not on file   Years of education: Not on file   Highest education level: Not on file  Occupational History   Not on file  Tobacco Use   Smoking status: Some Days    Current packs/day: 1.00    Average packs/day: 1 pack/day for 10.0 years (10.0 ttl pk-yrs)    Types: Cigarettes   Smokeless tobacco: Never  Substance and Sexual Activity   Alcohol use: No   Drug use: Yes    Types: Marijuana    Comment: rare   Sexual activity: Yes    Birth control/protection: Condom  Other Topics Concern   Not on file  Social History Narrative   Not on file   Social Drivers of Health   Tobacco Use:  High Risk (02/28/2024)   Patient History    Smoking Tobacco Use: Some Days    Smokeless Tobacco Use: Never    Passive Exposure: Not on file  Financial Resource Strain: Not on File (05/27/2021)   Received from [facility]    Financial Resource Strain: 0  Food Insecurity: Not on File (11/03/2022)   Received from [facility] Insecurity    Food: 0  Transportation Needs: Not on File (05/27/2021)   Received from [facility] Needs    Transportation: 0  Physical Activity: Not on File (05/27/2021)   Received from [facility]   Physical Activity    Physical Activity: 0  Stress: Not on File (05/27/2021)   Received from [facility]   Stress    Stress: 0  Social Connections: Not on File (10/22/2022)   [facility] from [facility]   Social Connections    Connectedness: 0  Depression (PHQ2-9): Low Risk (02/28/2024)   Depression (PHQ2-9)    PHQ-2 Score: 0  Alcohol Screen: Not on file  Housing: High Risk (09/21/2023)   Epic    Unable to Pay for Housing in the Last Year: Not on file  Number of Times Moved in the Last Year: Not on file    Homeless in the Last Year: Yes  Utilities: Not on file  Health Literacy: Not on file    Allergies[1]  Outpatient Medications Prior to Visit  Medication Sig Dispense Refill   terbinafine  (LAMISIL ) 250 MG tablet Take 1 tablet (250 mg total) by mouth daily. (Patient not taking: Reported on 02/28/2024) 14 tablet 0   No facility-administered medications prior to visit.     ROS Review of Systems  Constitutional:  Negative for activity change and appetite change.  HENT:  Negative for sinus pressure and sore throat.   Respiratory:  Negative for chest tightness, shortness of breath and wheezing.   Cardiovascular:  Negative for chest pain and palpitations.  Gastrointestinal:  Negative for abdominal distention, abdominal pain and constipation.  Genitourinary: Negative.   Musculoskeletal:        See HPI  Psychiatric/Behavioral:  Negative for  behavioral problems and dysphoric mood.     Objective:  BP 117/69   Pulse (!) 59   Temp 98.5 F (36.9 C) (Oral)   Ht 6' (1.829 m)   Wt 176 lb 12.8 oz (80.2 kg)   SpO2 100%   BMI 23.98 kg/m      02/28/2024    3:02 PM 09/21/2023   11:15 AM 11/01/2022    4:15 PM  BP/Weight  Systolic BP 117 122 120  Diastolic BP 69 82 78  Wt. (Lbs) 176.8 168.8   BMI 23.98 kg/m2 22.89 kg/m2       Physical Exam Constitutional:      Appearance: He is well-developed.  Cardiovascular:     Rate and Rhythm: Bradycardia present.     Heart sounds: Normal heart sounds. No murmur heard. Pulmonary:     Effort: Pulmonary effort is normal.     Breath sounds: Normal breath sounds. No wheezing or rales.  Chest:     Chest wall: No tenderness.  Abdominal:     General: Bowel sounds are normal. There is no distension.     Palpations: Abdomen is soft. There is no mass.     Tenderness: There is no abdominal tenderness.  Musculoskeletal:        General: Normal range of motion.     Right lower leg: No edema.     Left lower leg: No edema.     Comments: Ganglion cyst of right dorsal thumb inferomedial of to distal PIP joint.  Not tender to palpation  Neurological:     Mental Status: He is alert and oriented to person, place, and time.  Psychiatric:        Mood and Affect: Mood normal.        Latest Ref Rng & Units 03/08/2022    3:53 PM 07/03/2019    6:29 PM 04/14/2019    8:30 AM  CMP  Glucose 70 - 99 mg/dL 80  861  864   BUN 6 - 24 mg/dL 10  11  11    Creatinine 0.76 - 1.27 mg/dL 9.07  8.93  8.86   Sodium 134 - 144 mmol/L 139  137  141   Potassium 3.5 - 5.2 mmol/L 4.5  4.3  3.8   Chloride 96 - 106 mmol/L 102  104  104   CO2 20 - 29 mmol/L 26  25  29    Calcium 8.7 - 10.2 mg/dL 9.1  9.3  9.1   Total Protein 6.0 - 8.5 g/dL 6.1  7.0  6.9   Total  Bilirubin 0.0 - 1.2 mg/dL 0.3  0.3  0.5   Alkaline Phos 44 - 121 IU/L 50  54  66   AST 0 - 40 IU/L 17  19  18    ALT 0 - 44 IU/L 10  13  13      Lipid Panel      Component Value Date/Time   CHOL 155 03/08/2022 1553   TRIG 99 03/08/2022 1553   HDL 59 03/08/2022 1553   CHOLHDL 2.6 03/08/2022 1553   LDLCALC 78 03/08/2022 1553    CBC    Component Value Date/Time   WBC 6.9 03/08/2022 1553   WBC 10.0 07/03/2019 1829   RBC 4.87 03/08/2022 1553   RBC 4.73 07/03/2019 1829   HGB 14.4 03/08/2022 1553   HCT 42.3 03/08/2022 1553   PLT 243 03/08/2022 1553   MCV 87 03/08/2022 1553   MCH 29.6 03/08/2022 1553   MCH 29.4 07/03/2019 1829   MCHC 34.0 03/08/2022 1553   MCHC 32.6 07/03/2019 1829   RDW 13.5 03/08/2022 1553   LYMPHSABS 3.1 03/08/2022 1553   MONOABS 0.4 07/03/2019 1829   EOSABS 0.1 03/08/2022 1553   BASOSABS 0.1 03/08/2022 1553    Lab Results  Component Value Date   HGBA1C 5.5 03/08/2022       Assessment & Plan 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 No significant discomfort or impairment. Monitoring condition unless symptoms worsen. - Monitor condition unless symptoms worsen. - Refer to hand surgeon if future cyst removal desired.  Onychomycosis Thickened toenails. Interested in starting terbinafine  treatment. - Ordered liver function tests for terbinafine  monitoring. - Sent terbinafine  prescription to pharmacy.  General health maintenance Routine health maintenance discussed. No blood work in two years. Stool test returned for colon cancer screening. - Ordered blood tests for kidney and liver function.        Meds ordered this encounter  Medications   terbinafine  (LAMISIL ) 250 MG tablet    Sig: Take 1 tablet (250 mg total) by mouth daily.    Dispense:  14 tablet    Refill:  0    Follow-up: Return if symptoms worsen or fail to improve.       Corrina Sabin, MD, FAAFP. [facility] [place], [place] [phone]   02/28/2024, 5:59 PM     [1]  Allergies Allergen Reactions   Other    "

## 2024-02-29 ENCOUNTER — Ambulatory Visit: Payer: Self-pay | Admitting: Family Medicine

## 2024-02-29 LAB — CMP14+EGFR
ALT: 17 IU/L (ref 0–44)
AST: 19 IU/L (ref 0–40)
Albumin: 4.5 g/dL (ref 3.8–4.9)
Alkaline Phosphatase: 68 IU/L (ref 47–123)
BUN/Creatinine Ratio: 13 (ref 9–20)
BUN: 12 mg/dL (ref 6–24)
Bilirubin Total: 0.2 mg/dL (ref 0.0–1.2)
CO2: 26 mmol/L (ref 20–29)
Calcium: 9.5 mg/dL (ref 8.7–10.2)
Chloride: 103 mmol/L (ref 96–106)
Creatinine, Ser: 0.96 mg/dL (ref 0.76–1.27)
Globulin, Total: 2.2 g/dL (ref 1.5–4.5)
Glucose: 104 mg/dL — ABNORMAL HIGH (ref 70–99)
Potassium: 4.9 mmol/L (ref 3.5–5.2)
Sodium: 141 mmol/L (ref 134–144)
Total Protein: 6.7 g/dL (ref 6.0–8.5)
eGFR: 96 mL/min/1.73

## 2024-02-29 LAB — FECAL OCCULT BLOOD, IMMUNOCHEMICAL: Fecal Occult Bld: NEGATIVE

## 2024-03-01 ENCOUNTER — Ambulatory Visit: Payer: Self-pay | Admitting: Critical Care Medicine

## 2024-03-01 NOTE — Progress Notes (Signed)
 Pease let Bryce Baxter know his fecal occult test was negative, no colon cancer, repeat one year
